# Patient Record
Sex: Female | Born: 1975 | Race: White | Hispanic: No | Marital: Married | Smoking: Current some day smoker
Health system: Southern US, Community
[De-identification: ages and names within clinical notes are randomized; demographics above are authoritative.]

## PROBLEM LIST (undated history)

## (undated) DIAGNOSIS — R569 Unspecified convulsions: Secondary | ICD-10-CM

## (undated) DIAGNOSIS — K649 Unspecified hemorrhoids: Secondary | ICD-10-CM

## (undated) DIAGNOSIS — F411 Generalized anxiety disorder: Secondary | ICD-10-CM

## (undated) DIAGNOSIS — F419 Anxiety disorder, unspecified: Secondary | ICD-10-CM

## (undated) DIAGNOSIS — R05 Cough: Secondary | ICD-10-CM

## (undated) DIAGNOSIS — G43909 Migraine, unspecified, not intractable, without status migrainosus: Secondary | ICD-10-CM

## (undated) DIAGNOSIS — Z972 Presence of dental prosthetic device (complete) (partial): Secondary | ICD-10-CM

## (undated) DIAGNOSIS — R000 Tachycardia, unspecified: Secondary | ICD-10-CM

## (undated) DIAGNOSIS — R011 Cardiac murmur, unspecified: Secondary | ICD-10-CM

## (undated) HISTORY — PX: NOVASURE ABLATION: SHX5394

## (undated) HISTORY — DX: Anxiety disorder, unspecified: F41.9

## (undated) HISTORY — PX: DILATION AND CURETTAGE OF UTERUS: SHX78

## (undated) HISTORY — DX: Cardiac murmur, unspecified: R01.1

## (undated) HISTORY — DX: Unspecified convulsions: R56.9

---
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
## 2005-10-26 ENCOUNTER — Inpatient Hospital Stay (HOSPITAL_COMMUNITY): Admission: AD | Admit: 2005-10-26 | Discharge: 2005-10-26 | Payer: Self-pay | Admitting: Family Medicine

## 2005-10-28 ENCOUNTER — Inpatient Hospital Stay (HOSPITAL_COMMUNITY): Admission: AD | Admit: 2005-10-28 | Discharge: 2005-10-28 | Payer: Self-pay | Admitting: Gynecology

## 2005-11-10 ENCOUNTER — Inpatient Hospital Stay (HOSPITAL_COMMUNITY): Admission: RE | Admit: 2005-11-10 | Discharge: 2005-11-10 | Payer: Self-pay | Admitting: Obstetrics and Gynecology

## 2005-12-20 ENCOUNTER — Inpatient Hospital Stay (HOSPITAL_COMMUNITY): Admission: AD | Admit: 2005-12-20 | Discharge: 2005-12-20 | Payer: Self-pay | Admitting: Obstetrics & Gynecology

## 2006-01-08 ENCOUNTER — Emergency Department (HOSPITAL_COMMUNITY): Admission: EM | Admit: 2006-01-08 | Discharge: 2006-01-08 | Payer: Self-pay | Admitting: Emergency Medicine

## 2006-04-21 ENCOUNTER — Inpatient Hospital Stay (HOSPITAL_COMMUNITY): Admission: AD | Admit: 2006-04-21 | Discharge: 2006-04-21 | Payer: Self-pay | Admitting: Obstetrics and Gynecology

## 2006-06-18 ENCOUNTER — Inpatient Hospital Stay (HOSPITAL_COMMUNITY): Admission: RE | Admit: 2006-06-18 | Discharge: 2006-06-20 | Payer: Self-pay | Admitting: Obstetrics and Gynecology

## 2006-10-23 ENCOUNTER — Emergency Department (HOSPITAL_COMMUNITY): Admission: EM | Admit: 2006-10-23 | Discharge: 2006-10-23 | Payer: Self-pay | Admitting: Emergency Medicine

## 2007-07-15 ENCOUNTER — Emergency Department (HOSPITAL_COMMUNITY): Admission: EM | Admit: 2007-07-15 | Discharge: 2007-07-15 | Payer: Self-pay | Admitting: Emergency Medicine

## 2007-07-18 ENCOUNTER — Emergency Department (HOSPITAL_COMMUNITY): Admission: EM | Admit: 2007-07-18 | Discharge: 2007-07-18 | Payer: Self-pay | Admitting: Emergency Medicine

## 2008-04-07 ENCOUNTER — Inpatient Hospital Stay (HOSPITAL_COMMUNITY): Admission: AD | Admit: 2008-04-07 | Discharge: 2008-04-07 | Payer: Self-pay | Admitting: Obstetrics and Gynecology

## 2008-04-07 ENCOUNTER — Ambulatory Visit: Payer: Self-pay | Admitting: Family Medicine

## 2008-08-31 ENCOUNTER — Inpatient Hospital Stay (HOSPITAL_COMMUNITY): Admission: RE | Admit: 2008-08-31 | Discharge: 2008-09-01 | Payer: Self-pay | Admitting: Obstetrics and Gynecology

## 2009-10-05 ENCOUNTER — Emergency Department (HOSPITAL_COMMUNITY): Admission: EM | Admit: 2009-10-05 | Discharge: 2009-10-06 | Payer: Self-pay | Admitting: Emergency Medicine

## 2009-12-04 ENCOUNTER — Encounter: Admission: RE | Admit: 2009-12-04 | Discharge: 2009-12-04 | Payer: Self-pay | Admitting: Family Medicine

## 2010-04-04 LAB — RAPID URINE DRUG SCREEN, HOSP PERFORMED
Amphetamines: NOT DETECTED
Barbiturates: NOT DETECTED
Opiates: NOT DETECTED
Tetrahydrocannabinol: NOT DETECTED

## 2010-04-27 LAB — CBC
HCT: 27.7 % — ABNORMAL LOW (ref 36.0–46.0)
HCT: 33.3 % — ABNORMAL LOW (ref 36.0–46.0)
Hemoglobin: 11.5 g/dL — ABNORMAL LOW (ref 12.0–15.0)
MCHC: 34.7 g/dL (ref 30.0–36.0)
MCV: 95.9 fL (ref 78.0–100.0)
RBC: 2.89 MIL/uL — ABNORMAL LOW (ref 3.87–5.11)
RBC: 3.5 MIL/uL — ABNORMAL LOW (ref 3.87–5.11)
RDW: 12.6 % (ref 11.5–15.5)
WBC: 8.3 10*3/uL (ref 4.0–10.5)

## 2010-05-02 LAB — WET PREP, GENITAL: Yeast Wet Prep HPF POC: NONE SEEN

## 2010-05-02 LAB — URINALYSIS, ROUTINE W REFLEX MICROSCOPIC
Nitrite: NEGATIVE
Protein, ur: NEGATIVE mg/dL
Specific Gravity, Urine: <1.005 — ABNORMAL LOW (ref 1.005–1.030)
Urobilinogen, UA: 0.2 mg/dL (ref 0.0–1.0)

## 2010-05-02 LAB — GC/CHLAMYDIA PROBE AMP, GENITAL
Chlamydia, DNA Probe: NEGATIVE
GC Probe Amp, Genital: NEGATIVE

## 2010-06-04 NOTE — Discharge Summary (Signed)
NAMEARRINGTON, BENCOMO NO.:  [id number]   MEDICAL RECORD NO.:  [id number]          PATIENT TYPE:  INP   LOCATION:  9126                          FACILITY:  [facility]   PHYSICIAN:  Huel Cote, M.D. DATE OF BIRTH:  [birth year]   DATE OF ADMISSION:  06/18/2006  DATE OF DISCHARGE:  06/20/2006                               DISCHARGE SUMMARY   DISCHARGE DIAGNOSES:  1. Term pregnancy at 39 weeks delivered.  2. Status post normal spontaneous vaginal delivery.   DISCHARGE MEDICATIONS:  1. Motrin 600 mg every 6 hours.  2. Percocet 1-2 tablets p.o. every 4 hours p.r.n.  3. Iron sulfate twice daily 325 mg.   DISCHARGE FOLLOWUP:  The patient is to follow up my office in  approximately 6 weeks.   HOSPITAL COURSE:  The patient is 35 year old G5, P 1-0-3-1 who was  admitted at [redacted] weeks gestation for induction of labor.  Prenatal care  was uncomplicated.  The patient did have a spouse who is serving in [place]  and could not be present for the delivery.  Therefore, they arranged web  cast of the delivery and for partially this social reason, the patient  was being induced.   PRENATAL LABORATORIES:  A+, antibody negative, GC negative, chlamydia  negative, group B strep negative.  One-hour Glucola was 129.  Hepatitis  B surface antigen negative, HIV negative, rubella immune, RPR  nonreactive.   PAST OBSTETRICAL HISTORY:  The patient has had 3 spontaneous losses.  She required D&Cs.  She had one vaginal delivery of a female infant  previously.   PAST MEDICAL HISTORY:  1. History of migraines.  2. History of anxiety and depression.   PAST SURGICAL HISTORY:  Tonsillectomy and adenoidectomy.   ALLERGIES:  She had no known drug allergies.   MEDICATIONS:  Prenatal vitamins.   PHYSICAL EXAMINATION ON ADMISSION:  VITAL SIGNS:  She was afebrile with  stable vital signs.  Fetal heart rate was reactive.  PELVIC:  Cervix was 2-3, 50% effaced and a -2 station.   She had rupture of  membranes performed with clear fluid noted.  She  progressed rapidly to complete and pushed well with a viable female  infant delivered.  Apgars were 9 at and one minute and 9 at five  minutes.  Weight was 6 pounds, 3 ounces.  Placenta delivered intact.  She had some the anterior right labial laceration and a left labial  laceration which was repaired with 3-0 Vicryl for hemostasis.  Immediately following delivery, the patient's hemoglobin was 7.9;  however, she remained asymptomatic.  She was ambulating well without any  dizziness and had no complaints really.  By postpartum day #2, she was  tolerating her p.o. medications and ambulating and her lochia was  normal.  Therefore, she was felt stable for discharge home and was  discharged to follow up in the office in 6 weeks.      Huel Cote, M.D.  Electronically Signed     KR/MEDQ  D:  06/20/2006  T:  06/20/2006  Job:  [id number]

## 2010-06-04 NOTE — H&P (Signed)
Melissa Haynes, Melissa Haynes              ACCOUNT NO.:  [id number]   MEDICAL RECORD NO.:  [id number]         PATIENT TYPE:  WINP   LOCATION:                                FACILITY:  [facility]   PHYSICIAN:  Sherron Monday, MD        DATE OF BIRTH:  15-Aug-1975   DATE OF ADMISSION:  08/31/2008  DATE OF DISCHARGE:                              HISTORY & PHYSICAL   ADMITTING DIAGNOSIS:  Intrauterine pregnancy at term with favorable  cervix for induction of labor.   PROCEDURE PLANNED:  Induction of labor.   HISTORY OF PRESENT ILLNESS:  Th[age]-year-old G6, P2-0-3-2 at 39 plus  weeks who presents for induction of labor, given favorable cervix.  She  states she has had good fetal movement.  No loss of fluid, no vaginal  bleeding and occasional contractions.  Her pregnancy has been relatively  uncomplicated.  For complete history, please refer back to her office  notes regarding her pregnancy.   PAST MEDICAL HISTORY:  Significant for anxiety and depression.  She has  a history of migraines.   PAST SURGICAL HISTORY:  Not significant.  She had a tonsillectomy in  1986.   GYN HISTORY:  She is a G6, P2-0-3-2 with G1, 2, 3 being first trimester  miscarriages, G4 being a vaginal delivery of a female infant who weighed 7  pounds 12 ounces, G5, being a vaginal delivery of a female infant,  weighing 6 pounds 3 ounces, G6 is the present pregnancy.  She has a  history of an abnormal Pap smear with repeat normal.  No history of  sexually transmitted diseases.   MEDICATIONS:  Include prenatal vitamins.   ALLERGIES:  NO KNOWN DRUG ALLERGIES.   SOCIAL HISTORY:  The patient admits to smoking one to two cigarettes per  day.  No alcohol or drug use in the pregnancy.   FAMILY HISTORY:  Significant for father's side of the family with heart  disease and mother and paternal grandmother with hypertension.  Psychiatric disorders on both sides of the family.  Sister with  diabetes.   PRENATAL LABS:  Hemoglobin is 11.9,  platelets 214,000.  RPR nonreactive,  rubella immune, hepatitis B surface antigen negative, HIV negative.  Pap  smear within normal limits.  A+, antibody screen negative, gonorrhea  negative, chlamydia negative, cystic fibrosis screen negative, first  trimester screen within normal limits.  AFP within normal limits.  Glucola of 119.  Group B strep screen was negative.  Ultrasound  performed on April 13, 2008 revealed normal anatomy, anterior placenta  and a previa that had resolved from her first trimester screen, also a  female infant.   PHYSICAL EXAM:  VITAL SIGNS:  On admission she is afebrile.  Vital signs  stable.  IN GENERAL:  No apparent distress.  CARDIOVASCULAR:  Regular rate and rhythm.  LUNGS:  Clear to auscultation bilaterally.  ABDOMEN:  Soft.  Fundus is nontender.  EXTREMITIES:  Symmetric and nontender.   ASSESSMENT/PLAN:  Th[age]-year-old G6, P2-0-3-2 at 39-plus weeks for  induction of labor, given favorable cervix.  Discussed with the patient  risks, benefits  and alternatives of the procedure.  She wishes to  proceed.  We will perform an artificial rupture of membranes and give  her Pitocin to augment her labor.      Sherron Monday, MD  Electronically Signed     JB/MEDQ  D:  08/30/2008  T:  08/30/2008  Job:  [id number]

## 2012-10-14 ENCOUNTER — Other Ambulatory Visit: Payer: Self-pay | Admitting: Internal Medicine

## 2012-10-14 ENCOUNTER — Ambulatory Visit
Admission: RE | Admit: 2012-10-14 | Discharge: 2012-10-14 | Disposition: A | Discharge status: Home / Self Care | Payer: 59 | Source: Ambulatory Visit | Attending: Internal Medicine | Admitting: Internal Medicine

## 2012-10-14 DIAGNOSIS — N200 Calculus of kidney: Secondary | ICD-10-CM

## 2012-10-14 DIAGNOSIS — R109 Unspecified abdominal pain: Secondary | ICD-10-CM

## 2012-10-15 ENCOUNTER — Other Ambulatory Visit: Payer: Self-pay | Admitting: Internal Medicine

## 2012-10-15 ENCOUNTER — Ambulatory Visit
Admission: RE | Admit: 2012-10-15 | Discharge: 2012-10-15 | Disposition: A | Discharge status: Home / Self Care | Payer: 59 | Source: Ambulatory Visit | Attending: Internal Medicine | Admitting: Internal Medicine

## 2012-10-15 DIAGNOSIS — IMO0002 Reserved for concepts with insufficient information to code with codable children: Secondary | ICD-10-CM

## 2012-10-27 ENCOUNTER — Emergency Department (HOSPITAL_COMMUNITY)
Admission: EM | Admit: 2012-10-27 | Discharge: 2012-10-28 | Disposition: A | Discharge status: Home / Self Care | Payer: 59 | Attending: Emergency Medicine | Admitting: Emergency Medicine

## 2012-10-27 ENCOUNTER — Encounter (HOSPITAL_COMMUNITY): Payer: Self-pay | Admitting: Emergency Medicine

## 2012-10-27 ENCOUNTER — Emergency Department (HOSPITAL_COMMUNITY): Payer: 59

## 2012-10-27 DIAGNOSIS — K644 Residual hemorrhoidal skin tags: Secondary | ICD-10-CM

## 2012-10-27 DIAGNOSIS — Z79899 Other long term (current) drug therapy: Secondary | ICD-10-CM | POA: Insufficient documentation

## 2012-10-27 DIAGNOSIS — K625 Hemorrhage of anus and rectum: Secondary | ICD-10-CM

## 2012-10-27 DIAGNOSIS — K921 Melena: Secondary | ICD-10-CM | POA: Insufficient documentation

## 2012-10-27 DIAGNOSIS — Z3202 Encounter for pregnancy test, result negative: Secondary | ICD-10-CM | POA: Insufficient documentation

## 2012-10-27 LAB — COMPREHENSIVE METABOLIC PANEL
ALT: 7 U/L (ref 0–35)
AST: 10 U/L (ref 0–37)
Alkaline Phosphatase: 40 U/L (ref 39–117)
CO2: 25 mEq/L (ref 19–32)
Calcium: 9 mg/dL (ref 8.4–10.5)
GFR calc non Af Amer: 77 mL/min — ABNORMAL LOW (ref >90)
Glucose, Bld: 102 mg/dL — ABNORMAL HIGH (ref 70–99)
Potassium: 3.5 mEq/L (ref 3.5–5.1)
Sodium: 140 mEq/L (ref 135–145)
Total Protein: 6.9 g/dL (ref 6.0–8.3)

## 2012-10-27 LAB — CBC WITH DIFFERENTIAL/PLATELET
Basophils Absolute: 0 10*3/uL (ref 0.0–0.1)
Eosinophils Relative: 2 % (ref 0–5)
Lymphocytes Relative: 22 % (ref 12–46)
Lymphs Abs: 1.8 10*3/uL (ref 0.7–4.0)
MCV: 87.9 fL (ref 78.0–100.0)
Neutrophils Relative %: 70 % (ref 43–77)
Platelets: 278 10*3/uL (ref 150–400)
RBC: 4.31 MIL/uL (ref 3.87–5.11)
RDW: 12 % (ref 11.5–15.5)
WBC: 8 10*3/uL (ref 4.0–10.5)

## 2012-10-27 NOTE — ED Notes (Signed)
Pt. reports rectal bleeding / bloody stools onset this morning , pt. stated results of CT scan taken 10/14/2012 shows metallic foreign object at RLQ .

## 2012-10-28 LAB — URINE MICROSCOPIC-ADD ON

## 2012-10-28 LAB — URINALYSIS, ROUTINE W REFLEX MICROSCOPIC
Bilirubin Urine: NEGATIVE
Hgb urine dipstick: NEGATIVE
Ketones, ur: NEGATIVE mg/dL
Specific Gravity, Urine: 1.025 (ref 1.005–1.030)
pH: 6 (ref 5.0–8.0)

## 2012-10-28 LAB — OCCULT BLOOD, POC DEVICE: Fecal Occult Bld: NEGATIVE

## 2012-10-28 NOTE — ED Provider Notes (Signed)
CSN: [id number]     Arrival date & time 10/27/12  2219 History   First MD Initiated Contact with Patient 10/27/12 2322     Chief Complaint  Patient presents with  . Rectal Bleeding   (Consider location/radiation/quality/duration/timing/severity/associated sxs/prior Treatment) HPI Comments: Patient presents with a chief complaint of rectal bleeding.  She states that she noticed a small amount of bright red blood with bowel movement this morning.  She states that she noticed the blood when she wiped.  She denies any pain with the bowel movement.  She denies melena.  Denies nausea, vomiting, or abdominal pain.  Denies fever or chills.  She reports that she has been diagnosed with hemorrhoids in the past.  She had a CT done of her abdomen and pelvis on 10/14/12, which showed a metallic foreign body of the RLQ.  Patient is unaware of what this could be.  She states that she is unsure what her Primary Care Physician plan was for this.    The history is provided by the patient.    History reviewed. No pertinent past medical history. History reviewed. No pertinent past surgical history. No family history on file. History  Substance Use Topics  . Smoking status: Never Smoker   . Smokeless tobacco: Not on file  . Alcohol Use: No   OB History   Grav Para Term Preterm Abortions TAB SAB Ect Mult Living                 Review of Systems  Gastrointestinal: Positive for blood in stool.  All other systems reviewed and are negative.    Allergies  Review of patient's allergies indicates not on file.  Home Medications   Current Outpatient Rx  Name  Route  Sig  Dispense  Refill  . clonazePAM (KLONOPIN) 0.5 MG tablet   Oral   Take 0.5 mg by mouth daily as needed for anxiety.         Marland Kitchen HYDROcodone-acetaminophen (NORCO/VICODIN) 5-325 MG per tablet   Oral   Take 1 tablet by mouth as needed.         . nortriptyline (PAMELOR) 25 MG capsule   Oral   Take 50 mg by mouth daily.         Marland Kitchen  topiramate (TOPAMAX) 200 MG tablet   Oral   Take 100 mg by mouth 2 (two) times daily.          BP 102/69  Pulse 100  Temp(Src) 98.2 F (36.8 C) (Oral)  Resp 16  Ht 5\' 2"  (1.575 m)  Wt 139 lb 9.6 oz (63.322 kg)  BMI 25.53 kg/m2  SpO2 100% Physical Exam  Nursing note and vitals reviewed. Constitutional: She appears well-developed and well-nourished.  HENT:  Head: Normocephalic and atraumatic.  Neck: Normal range of motion. Neck supple.  Cardiovascular: Normal rate, regular rhythm and normal heart sounds.   Pulmonary/Chest: Effort normal and breath sounds normal.  Abdominal: Soft. Bowel sounds are normal. She exhibits no distension and no mass. There is no tenderness. There is no rebound and no guarding.  Genitourinary: Rectal exam shows external hemorrhoid.  Non thrombosed external hemorrhoids  Neurological: She is alert.  Skin: Skin is warm and dry.  Psychiatric: She has a normal mood and affect.    ED Course  Procedures (including critical care time) Labs Review Labs Reviewed  COMPREHENSIVE METABOLIC PANEL - Abnormal; Notable for the following:    Glucose, Bld 102 (*)    Total Bilirubin 0.2 (*)  GFR calc non Af Amer 77 (*)    GFR calc Af Amer 90 (*)    All other components within normal limits  URINALYSIS, ROUTINE W REFLEX MICROSCOPIC - Abnormal; Notable for the following:    APPearance CLOUDY (*)    Leukocytes, UA TRACE (*)    All other components within normal limits  URINE MICROSCOPIC-ADD ON - Abnormal; Notable for the following:    Squamous Epithelial / LPF MANY (*)    Bacteria, UA MANY (*)    All other components within normal limits  URINE CULTURE  CBC WITH DIFFERENTIAL  PREGNANCY, URINE  OCCULT BLOOD X 1 CARD TO LAB, STOOL   Imaging Review Dg Abd Acute W/chest  10/28/2012   CLINICAL DATA:  Hematochezia, history of metallic foreign body in the colon  EXAM: ACUTE ABDOMEN SERIES (ABDOMEN 2 VIEW & CHEST 1 VIEW)  COMPARISON:  Prior CT abdomen/ pelvis  10/14/2012; prior abdominal radiographs 10/15/2012  FINDINGS: There is no evidence of dilated bowel loops or free intraperitoneal air. No radiopaque calculi or other significant radiographic abnormality is seen. Heart size and mediastinal contours are within normal limits. Both lungs are clear. Linear metallic opacities projecting over the chest are consistent with a personal adornments. The previously identified metallic foreign body in the cecum is no longer present.  IMPRESSION: Negative abdominal radiographs.  No acute cardiopulmonary disease.  The previously identified metallic foreign body in the region of the cecum is no longer present and has likely been passed.   Electronically Signed   By: Malachy Moan M.D.   On: 10/28/2012 00:29    MDM  No diagnosis found. Patient presenting with a small amount of bright red blood with bowel movement earlier today.  Rectal exam showing non thrombosed external hemorrhoids.  Hemoccult negative.  No abdominal pain on exam.  Previous foreign body of the RLQ is no longer present on xray today and likely has passed.  Feel that the patient is stable for discharge.  Return precautions given.    Pascal Lux [place], PA-C 10/29/12 [phone]

## 2012-10-28 NOTE — ED Notes (Signed)
Pt attempting to void. POCT occult blood card at bedside.

## 2012-10-28 NOTE — ED Notes (Signed)
PA at bedside.

## 2012-10-29 LAB — URINE CULTURE

## 2012-10-30 NOTE — ED Provider Notes (Signed)
Medical screening examination/treatment/procedure(s) were performed by non-physician practitioner and as supervising physician I was immediately available for consultation/collaboration.   Andreah Goheen, MD 10/30/12 0505 

## 2012-11-01 ENCOUNTER — Encounter (INDEPENDENT_AMBULATORY_CARE_PROVIDER_SITE_OTHER): Payer: Self-pay | Admitting: Surgery

## 2012-11-01 ENCOUNTER — Ambulatory Visit (INDEPENDENT_AMBULATORY_CARE_PROVIDER_SITE_OTHER): Payer: 59 | Admitting: Surgery

## 2012-11-01 VITALS — BP 118/78 | HR 100 | Temp 98.9°F | Resp 14 | Ht 62.0 in | Wt 142.0 lb

## 2012-11-01 DIAGNOSIS — K649 Unspecified hemorrhoids: Secondary | ICD-10-CM

## 2012-11-01 NOTE — Progress Notes (Signed)
Patient ID: Melissa Haynes, female   DOB: 1975-12-23, 37 y.o.   MRN: [id number]  Chief Complaint  Patient presents with  . New Evaluation    eval hems/ bleeding    HPI Melissa Haynes is a 37 y.o. female.  Patient sent at the request of Dr. Waynard Edwards for internal hemorrhoid disease. She has had a history of rectal bleeding and prolapse of tissue for the last 14 years since the birth of her role as child. She has mild to moderate discomfort with some burning and itching and rectal bleeding. She relates a history of right flank pain. This was evaluated by CT scanning which showed a foreign body or GI tract. She's not sure she is passed this but had some rectal bleeding which was evaluated in the emergency room and felt to be consistent with hemorrhoid bleeding. Most of this is when she wipes. She has not had constipation or more strain often. HPI  Past Medical History  Diagnosis Date  . Heart murmur   . Seizures     Past Surgical History  Procedure Laterality Date  . Novasure ablation    . Dilation and curettage of uterus      Family History  Problem Relation Age of Onset  . Cancer Father     pancreatic, lung    Social History History  Substance Use Topics  . Smoking status: Former Smoker    Quit date: 10/03/2010  . Smokeless tobacco: Never Used  . Alcohol Use: No    No Known Allergies  Current Outpatient Prescriptions  Medication Sig Dispense Refill  . clonazePAM (KLONOPIN) 0.5 MG tablet Take 0.5 mg by mouth daily as needed for anxiety.      Marland Kitchen HYDROcodone-acetaminophen (NORCO/VICODIN) 5-325 MG per tablet Take 1 tablet by mouth as needed.      . nortriptyline (PAMELOR) 25 MG capsule Take 50 mg by mouth daily.      . RELPAX 40 MG tablet       . topiramate (TOPAMAX) 200 MG tablet Take 100 mg by mouth 2 (two) times daily.       No current facility-administered medications for this visit.    Review of Systems Review of Systems  Constitutional: Negative for fever, chills  and unexpected weight change.  HENT: Negative for congestion, hearing loss, sore throat, trouble swallowing and voice change.   Eyes: Negative for visual disturbance.  Respiratory: Negative for cough and wheezing.   Cardiovascular: Negative for chest pain, palpitations and leg swelling.  Gastrointestinal: Negative for nausea, vomiting, abdominal pain, diarrhea, constipation, blood in stool, abdominal distention and anal bleeding.  Genitourinary: Negative for hematuria, vaginal bleeding and difficulty urinating.  Musculoskeletal: Negative for arthralgias.  Skin: Negative for rash and wound.  Neurological: Negative for seizures, syncope and headaches.  Hematological: Negative for adenopathy. Does not bruise/bleed easily.  Psychiatric/Behavioral: Negative for confusion.    Blood pressure 118/78, pulse 100, temperature 98.9 F (37.2 C), temperature source Temporal, resp. rate 14, height 5\' 2"  (1.575 m), weight 142 lb (64.411 kg).  Physical Exam Physical Exam  Constitutional: She appears well-developed.  Pulmonary/Chest: Breath sounds normal. No respiratory distress.  Genitourinary:     Musculoskeletal: Normal range of motion.  Neurological: She is alert.  Skin: Skin is warm and dry.  Psychiatric: She has a normal mood and affect. Her behavior is normal. Thought content normal.    Data Reviewed CT abdomen pelvis  Assessment    Grade 3 internal and external hemorrhoid disease with symptoms  Plan    Discussed medical and surgical approaches to her problem. Discussed pathophysiology with her today. These appear to be too large to band or inject. Discussed medical approaches to her problem. Discussed hemorrhoidectomy if her as well as the pros and cons. Complications discussed. Postoperative recovery and work limitations discussed. She would like time to think about surgery but has an interest in hemorrhoidectomy at this point in time.The procedure has been discussed with the  patient.  Alternative therapies have been discussed with the patient.  Operative risks include bleeding,  Infection,  Organ injury,  Nerve injury,  Blood vessel injury,  DVT,  Pulmonary embolism,  Death,  And possible reoperation.  Medical management risks include worsening of present situation.  The success of the procedure is 50 -90 % at treating patients symptoms.  The patient understands and agrees to proceed.       Melissa Haynes A. 11/01/2012, 3:57 PM

## 2012-11-01 NOTE — Patient Instructions (Signed)
Hemorrhoidectomy Hemorrhoidectomy is surgery to remove hemorrhoids. Hemorrhoids are veins that have become swollen in the rectum. The rectum is the area from the bottom end of the intestines to the opening where bowel movements leave the body. Hemorrhoids can be uncomfortable. They can cause itching, bleeding and pain if a blood clot forms in them (thrombose). If hemorrhoids are small, surgery may not be needed. But if they cover a larger area, surgery is usually suggested.  LET YOUR CAREGIVER KNOW ABOUT:   Any allergies.  All medications you are taking, including:  Herbs, eyedrops, over-the-counter medications and creams.  Blood thinners (anticoagulants), aspirin or other drugs that could affect blood clotting.  Use of steroids (by mouth or as creams).  Previous problems with anesthetics, including local anesthetics.  Possibility of pregnancy, if this applies.  Any history of blood clots.  Any history of bleeding or other blood problems.  Previous surgery.  Smoking history.  Other health problems. RISKS AND COMPLICATIONS All surgery carries some risk. However, hemorrhoid surgery usually goes smoothly. Possible complications could include:  Urinary retention.  Bleeding.  Infection.  A painful incision.  A reaction to the anesthesia (this is not common). BEFORE THE PROCEDURE   Stop using aspirin and non-steroidal anti-inflammatory drugs (NSAIDs) for pain relief. This includes prescription drugs and over-the-counter drugs such as ibuprofen and naproxen. Also stop taking vitamin E. If possible, do this two weeks before your surgery.  If you take blood-thinners, ask your healthcare provider when you should stop taking them.  You will probably have blood and urine tests done several days before your surgery.  Do not eat or drink for about 8 hours before the surgery.  Arrive at least an hour before the surgery, or whenever your surgeon recommends. This will give you time to  check in and fill out any needed paperwork.  Hemorrhoidectomy is often an outpatient procedure. This means you will be able to go home the same day. Sometimes, though, people stay overnight in the hospital after the procedure. Ask your surgeon what to expect. Either way, make arrangements in advance for someone to drive you home. PROCEDURE   The preparation:  You will change into a hospital gown.  You will be given an IV. A needle will be inserted in your arm. Medication can flow directly into your body through this needle.  You might be given an enema to clear your rectum.  Once in the operating room, you will probably lie on your side or be repositioned later to lying on your stomach.  You will be given anesthesia (medication) so you will not feel anything during the surgery. The surgery often is done with local anesthesia (the area near the hemorrhoids will be numb and you will be drowsy but awake). Sometimes, general anesthesia is used (you will be asleep during the procedure).  The procedure:  There are a few different procedures for hemorrhoids. Be sure to ask you surgeon about the procedure, the risks and benefits.  Be sure to ask about what you need to do to take care of the wound, if there is one. AFTER THE PROCEDURE  You will stay in a recovery area until the anesthesia has worn off. Your blood pressure and pulse will be checked every so often.  You may feel a lot of pain in the area of the rectum.  Take all pain medication prescribed by your surgeon. Ask before taking any over-the-counter pain medicines.  Sometimes sitting in a warm bath can help relieve   your pain.  To make sure you have bowel movements without straining:  You will probably need to take stool softeners (usually a pill) for a few days.  You should drink 8 to 10 glasses of water each day.  Your activity will be restricted for awhile. Ask your caregiver for a list of what you should and should not do  while you recover. Document Released: 11/03/2008 Document Revised: 03/31/2011 Document Reviewed: 11/03/2008 [employer] Patient Information 2014 [employer].  

## 2012-12-17 ENCOUNTER — Telehealth (INDEPENDENT_AMBULATORY_CARE_PROVIDER_SITE_OTHER): Payer: Self-pay | Admitting: Surgery

## 2012-12-17 NOTE — Telephone Encounter (Signed)
Patient called with two week history of rectal bleeding, now becoming more frequent and persistent past two days.  Please arrange evaluation with Dr. Luisa Hart or in Urgent Office on Monday, 12/1.  I told patient our office would call her on Monday morning.  I told patient if bleeding became more severe to go to ER for assessment this weekend.  I advised baby wipes, tub soaks, and stool softeners.  Velora Heckler, MD, [facility] Office: [phone]

## 2012-12-20 ENCOUNTER — Telehealth (INDEPENDENT_AMBULATORY_CARE_PROVIDER_SITE_OTHER): Payer: Self-pay

## 2012-12-20 DIAGNOSIS — K649 Unspecified hemorrhoids: Secondary | ICD-10-CM

## 2012-12-20 HISTORY — DX: Unspecified hemorrhoids: K64.9

## 2012-12-20 NOTE — Telephone Encounter (Signed)
LMOM> Called pt to check on her and to make appointment for her to see Dr Luisa Hart this afternoon. Will await return call from pt.

## 2012-12-20 NOTE — Telephone Encounter (Signed)
Pt returning Melissa Haynes's call.  Message below relayed.  Pt states that she is doing better.  The pain has subsided, and the rectal bleeding as stopped as of today.  Pt states that she is working today and will not be able to come in today.  Made appointment for Tuesday, Dec 9 to see Dr. Luisa Hart.

## 2012-12-20 NOTE — Telephone Encounter (Signed)
This encounter was created in error - please disregard.

## 2012-12-28 ENCOUNTER — Ambulatory Visit (INDEPENDENT_AMBULATORY_CARE_PROVIDER_SITE_OTHER): Payer: 59 | Admitting: Surgery

## 2012-12-28 ENCOUNTER — Encounter (INDEPENDENT_AMBULATORY_CARE_PROVIDER_SITE_OTHER): Payer: Self-pay

## 2012-12-28 ENCOUNTER — Encounter (INDEPENDENT_AMBULATORY_CARE_PROVIDER_SITE_OTHER): Payer: Self-pay | Admitting: Surgery

## 2012-12-28 VITALS — BP 132/82 | HR 112 | Temp 98.0°F | Resp 18 | Ht 64.0 in | Wt 135.0 lb

## 2012-12-28 DIAGNOSIS — K649 Unspecified hemorrhoids: Secondary | ICD-10-CM

## 2012-12-28 MED ORDER — HYDROCORTISONE ACE-PRAMOXINE 1-1 % RE FOAM: 1.0000 | Freq: Two times a day (BID) | RECTAL | Status: DC

## 2012-12-28 NOTE — Patient Instructions (Signed)
Hemorrhoidectomy Hemorrhoidectomy is surgery to remove hemorrhoids. Hemorrhoids are veins that have become swollen in the rectum. The rectum is the area from the bottom end of the intestines to the opening where bowel movements leave the body. Hemorrhoids can be uncomfortable. They can cause itching, bleeding and pain if a blood clot forms in them (thrombose). If hemorrhoids are small, surgery may not be needed. But if they cover a larger area, surgery is usually suggested.  LET YOUR CAREGIVER KNOW ABOUT:   Any allergies.  All medications you are taking, including:  Herbs, eyedrops, over-the-counter medications and creams.  Blood thinners (anticoagulants), aspirin or other drugs that could affect blood clotting.  Use of steroids (by mouth or as creams).  Previous problems with anesthetics, including local anesthetics.  Possibility of pregnancy, if this applies.  Any history of blood clots.  Any history of bleeding or other blood problems.  Previous surgery.  Smoking history.  Other health problems. RISKS AND COMPLICATIONS All surgery carries some risk. However, hemorrhoid surgery usually goes smoothly. Possible complications could include:  Urinary retention.  Bleeding.  Infection.  A painful incision.  A reaction to the anesthesia (this is not common). BEFORE THE PROCEDURE   Stop using aspirin and non-steroidal anti-inflammatory drugs (NSAIDs) for pain relief. This includes prescription drugs and over-the-counter drugs such as ibuprofen and naproxen. Also stop taking vitamin E. If possible, do this two weeks before your surgery.  If you take blood-thinners, ask your healthcare provider when you should stop taking them.  You will probably have blood and urine tests done several days before your surgery.  Do not eat or drink for about 8 hours before the surgery.  Arrive at least an hour before the surgery, or whenever your surgeon recommends. This will give you time to  check in and fill out any needed paperwork.  Hemorrhoidectomy is often an outpatient procedure. This means you will be able to go home the same day. Sometimes, though, people stay overnight in the hospital after the procedure. Ask your surgeon what to expect. Either way, make arrangements in advance for someone to drive you home. PROCEDURE   The preparation:  You will change into a hospital gown.  You will be given an IV. A needle will be inserted in your arm. Medication can flow directly into your body through this needle.  You might be given an enema to clear your rectum.  Once in the operating room, you will probably lie on your side or be repositioned later to lying on your stomach.  You will be given anesthesia (medication) so you will not feel anything during the surgery. The surgery often is done with local anesthesia (the area near the hemorrhoids will be numb and you will be drowsy but awake). Sometimes, general anesthesia is used (you will be asleep during the procedure).  The procedure:  There are a few different procedures for hemorrhoids. Be sure to ask you surgeon about the procedure, the risks and benefits.  Be sure to ask about what you need to do to take care of the wound, if there is one. AFTER THE PROCEDURE  You will stay in a recovery area until the anesthesia has worn off. Your blood pressure and pulse will be checked every so often.  You may feel a lot of pain in the area of the rectum.  Take all pain medication prescribed by your surgeon. Ask before taking any over-the-counter pain medicines.  Sometimes sitting in a warm bath can help relieve   your pain.  To make sure you have bowel movements without straining:  You will probably need to take stool softeners (usually a pill) for a few days.  You should drink 8 to 10 glasses of water each day.  Your activity will be restricted for awhile. Ask your caregiver for a list of what you should and should not do  while you recover. Document Released: 11/03/2008 Document Revised: 03/31/2011 Document Reviewed: 11/03/2008 [employer] Patient Information 2014 [employer].  

## 2012-12-28 NOTE — Progress Notes (Signed)
Patient ID: Melissa Haynes, female   DOB: 11-23-1975, 37 y.o.   MRN: [id number]  Chief Complaint  Patient presents with  . Routine Post Op    rectal bleeding    HPI Melissa Haynes is a 37 y.o. female.  Patient sent at the request of Dr. Waynard Edwards for internal hemorrhoid disease. She has had a history of rectal bleeding and prolapse of tissue for the last 14 years since the birth of her role as child. She has mild to moderate discomfort with some burning and itching and rectal bleeding. She relates a history of right flank pain. This was evaluated by CT scanning which showed a foreign body or GI tract. She's not sure she is passed this but had some rectal bleeding which was evaluated in the emergency room and felt to be consistent with hemorrhoid bleeding. Most of this is when she wipes. She has not had constipation or more strain often.she is developed increased bleeding 3 weeks ago after sexual intercourse.. Patient denies anal intercourse. She called the on-call doctor for consultation. The bleeding stopped after about a week. It was intermittent and noticed after bowel movements. Is not continuous. It was dark in nature. It is now resolved.  HPI  Past Medical History  Diagnosis Date  . Heart murmur   . Seizures     Past Surgical History  Procedure Laterality Date  . Novasure ablation    . Dilation and curettage of uterus      Family History  Problem Relation Age of Onset  . Cancer Father     pancreatic, lung    Social History History  Substance Use Topics  . Smoking status: Former Smoker    Quit date: 10/03/2010  . Smokeless tobacco: Never Used  . Alcohol Use: No    No Known Allergies  Current Outpatient Prescriptions  Medication Sig Dispense Refill  . clonazePAM (KLONOPIN) 0.5 MG tablet Take 0.5 mg by mouth daily as needed for anxiety.      Marland Kitchen HYDROcodone-acetaminophen (NORCO/VICODIN) 5-325 MG per tablet Take 1 tablet by mouth as needed.      . nortriptyline (PAMELOR)  25 MG capsule Take 50 mg by mouth daily.      . RELPAX 40 MG tablet       . topiramate (TOPAMAX) 200 MG tablet Take 100 mg by mouth 2 (two) times daily.      . hydrocortisone-pramoxine (PROCTOFOAM HC) rectal foam Place 1 applicator rectally 2 (two) times daily.  10 g  0   No current facility-administered medications for this visit.    Review of Systems Review of Systems  Constitutional: Negative for fever, chills and unexpected weight change.  HENT: Negative for congestion, hearing loss, sore throat, trouble swallowing and voice change.   Eyes: Negative for visual disturbance.  Respiratory: Negative for cough and wheezing.   Cardiovascular: Negative for chest pain, palpitations and leg swelling.  Gastrointestinal: Negative for nausea, vomiting, abdominal pain, diarrhea, constipation, blood in stool, abdominal distention and anal bleeding.  Genitourinary: Negative for hematuria, vaginal bleeding and difficulty urinating.  Musculoskeletal: Negative for arthralgias.  Skin: Negative for rash and wound.  Neurological: Negative for seizures, syncope and headaches.  Hematological: Negative for adenopathy. Does not bruise/bleed easily.  Psychiatric/Behavioral: Negative for confusion.    Blood pressure 132/82, pulse 112, temperature 98 F (36.7 C), resp. rate 18, height 5\' 4"  (1.626 m), weight 135 lb (61.236 kg).  Physical Exam Physical Exam  Constitutional: She appears well-developed.  Pulmonary/Chest: Breath sounds  normal. No respiratory distress.  Genitourinary: grade 3 hemorrhoids noted. Mild prolapse noted. Reducible but friable. Tone  normal. Anoscopy: 3 column grade 3 congested internal hemorrhoid disease. Recommended sclerotherapy to two of these columns since they appeared to have been bleeding at some point. Discussed the procedure with the patient of sclerotherapy. She agreed to proceed after risks and benefits of the procedure were discussed. 1 cc of sclerosant was injected into  the right posterior column and left lateral column since the 2 had signs of recent bleeding and were friable. She tolerated the procedure well.   Musculoskeletal: Normal range of motion.  Neurological: She is alert.  Skin: Skin is warm and dry.  Psychiatric: She has a normal mood and affect. Her behavior is normal. Thought content normal.    Data Reviewed CT abdomen pelvis  Assessment    Grade 3 internal and external hemorrhoid disease with symptoms of increased bleeding    Plan    Discussed medical and surgical approaches to her problem. Discussed pathophysiology with her today. Sclerotherapy used with the hope of decreasing her bleeding episodes but she will require hemorrhoidectomy for definitive treatment. Proctofoam can be used in the meantime history down the tissue and hopefully keep her bleeding episodes minimal. Discussed medical approaches to her problem. Discussed hemorrhoidectomy with her as well as the pros and cons. Complications discussed. Postoperative recovery and work limitations discussed. She is ready for hemorrhoidectomy at this point in time.The procedure has been discussed with the patient.  Alternative therapies have been discussed with the patient.  Operative risks include bleeding,  Infection,  Organ injury,  Nerve injury,anal stenosis, recurrence,  Blood vessel injury,  DVT,  Pulmonary embolism,  Death,  And possible reoperation.  Medical management risks include worsening of present situation.  The success of the procedure is 50 -90 % at treating patients symptoms.  The patient understands and agrees to proceed.       Fredrick Dray A. 12/28/2012, 5:00 PM

## 2013-01-19 ENCOUNTER — Encounter (HOSPITAL_BASED_OUTPATIENT_CLINIC_OR_DEPARTMENT_OTHER): Payer: Self-pay | Admitting: *Deleted

## 2013-01-19 DIAGNOSIS — R058 Other specified cough: Secondary | ICD-10-CM

## 2013-01-19 HISTORY — DX: Other specified cough: R05.8

## 2013-01-19 NOTE — Pre-Procedure Instructions (Signed)
Discussed hx. of heart murmur and tachycardia with Dr. Ivin Booty; do an EKG when she comes for labs.

## 2013-01-19 NOTE — Pre-Procedure Instructions (Signed)
To come for CBC, diff, CMET 

## 2013-01-21 ENCOUNTER — Other Ambulatory Visit: Payer: Self-pay

## 2013-01-21 ENCOUNTER — Encounter (HOSPITAL_BASED_OUTPATIENT_CLINIC_OR_DEPARTMENT_OTHER)
Admission: RE | Admit: 2013-01-21 | Discharge: 2013-01-21 | Disposition: A | Discharge status: Home / Self Care | Payer: 59 | Source: Ambulatory Visit | Attending: Surgery | Admitting: Surgery

## 2013-01-21 DIAGNOSIS — Z01810 Encounter for preprocedural cardiovascular examination: Secondary | ICD-10-CM | POA: Insufficient documentation

## 2013-01-21 DIAGNOSIS — Z01818 Encounter for other preprocedural examination: Secondary | ICD-10-CM | POA: Insufficient documentation

## 2013-01-21 DIAGNOSIS — Z01812 Encounter for preprocedural laboratory examination: Secondary | ICD-10-CM | POA: Insufficient documentation

## 2013-01-21 LAB — COMPREHENSIVE METABOLIC PANEL
ALK PHOS: 34 U/L — AB (ref 39–117)
ALT: 11 U/L (ref 0–35)
AST: 11 U/L (ref 0–37)
Albumin: 3.9 g/dL (ref 3.5–5.2)
BILIRUBIN TOTAL: 0.3 mg/dL (ref 0.3–1.2)
BUN: 8 mg/dL (ref 6–23)
CHLORIDE: 109 meq/L (ref 96–112)
CO2: 24 meq/L (ref 19–32)
Calcium: 8.8 mg/dL (ref 8.4–10.5)
Creatinine, Ser: 0.85 mg/dL (ref 0.50–1.10)
GFR calc non Af Amer: 88 mL/min — ABNORMAL LOW (ref >90)
GLUCOSE: 81 mg/dL (ref 70–99)
POTASSIUM: 3.9 meq/L (ref 3.7–5.3)
Sodium: 145 mEq/L (ref 137–147)
TOTAL PROTEIN: 7 g/dL (ref 6.0–8.3)

## 2013-01-21 LAB — CBC WITH DIFFERENTIAL/PLATELET
Basophils Absolute: 0 10*3/uL (ref 0.0–0.1)
Basophils Relative: 0 % (ref 0–1)
EOS ABS: 0.1 10*3/uL (ref 0.0–0.7)
Eosinophils Relative: 2 % (ref 0–5)
HCT: 39.2 % (ref 36.0–46.0)
Hemoglobin: 13.9 g/dL (ref 12.0–15.0)
LYMPHS ABS: 1.8 10*3/uL (ref 0.7–4.0)
Lymphocytes Relative: 28 % (ref 12–46)
MCH: 31 pg (ref 26.0–34.0)
MCHC: 35.5 g/dL (ref 30.0–36.0)
MCV: 87.3 fL (ref 78.0–100.0)
MONOS PCT: 7 % (ref 3–12)
Monocytes Absolute: 0.5 10*3/uL (ref 0.1–1.0)
NEUTROS PCT: 63 % (ref 43–77)
Neutro Abs: 4.2 10*3/uL (ref 1.7–7.7)
Platelets: 307 10*3/uL (ref 150–400)
RBC: 4.49 MIL/uL (ref 3.87–5.11)
RDW: 12.3 % (ref 11.5–15.5)
WBC: 6.6 10*3/uL (ref 4.0–10.5)

## 2013-01-25 ENCOUNTER — Encounter (HOSPITAL_BASED_OUTPATIENT_CLINIC_OR_DEPARTMENT_OTHER): Payer: Self-pay | Admitting: *Deleted

## 2013-01-26 ENCOUNTER — Encounter (HOSPITAL_BASED_OUTPATIENT_CLINIC_OR_DEPARTMENT_OTHER)
Admission: RE | Disposition: A | Discharge status: Home / Self Care | Payer: Self-pay | Source: Ambulatory Visit | Attending: Surgery

## 2013-01-26 ENCOUNTER — Encounter (HOSPITAL_BASED_OUTPATIENT_CLINIC_OR_DEPARTMENT_OTHER): Payer: 59 | Admitting: Anesthesiology

## 2013-01-26 ENCOUNTER — Encounter (HOSPITAL_BASED_OUTPATIENT_CLINIC_OR_DEPARTMENT_OTHER): Payer: Self-pay | Admitting: Anesthesiology

## 2013-01-26 ENCOUNTER — Ambulatory Visit (HOSPITAL_BASED_OUTPATIENT_CLINIC_OR_DEPARTMENT_OTHER): Payer: 59 | Admitting: Anesthesiology

## 2013-01-26 ENCOUNTER — Ambulatory Visit (HOSPITAL_BASED_OUTPATIENT_CLINIC_OR_DEPARTMENT_OTHER)
Admission: RE | Admit: 2013-01-26 | Discharge: 2013-01-26 | Disposition: A | Discharge status: Home / Self Care | Payer: 59 | Source: Ambulatory Visit | Attending: Surgery | Admitting: Surgery

## 2013-01-26 DIAGNOSIS — Z87891 Personal history of nicotine dependence: Secondary | ICD-10-CM | POA: Insufficient documentation

## 2013-01-26 DIAGNOSIS — K644 Residual hemorrhoidal skin tags: Secondary | ICD-10-CM

## 2013-01-26 DIAGNOSIS — K648 Other hemorrhoids: Secondary | ICD-10-CM | POA: Insufficient documentation

## 2013-01-26 DIAGNOSIS — K649 Unspecified hemorrhoids: Secondary | ICD-10-CM

## 2013-01-26 HISTORY — PX: HEMORRHOID SURGERY: SHX153

## 2013-01-26 HISTORY — DX: Generalized anxiety disorder: F41.1

## 2013-01-26 HISTORY — DX: Unspecified hemorrhoids: K64.9

## 2013-01-26 HISTORY — DX: Presence of dental prosthetic device (complete) (partial): Z97.2

## 2013-01-26 HISTORY — DX: Migraine, unspecified, not intractable, without status migrainosus: G43.909

## 2013-01-26 HISTORY — DX: Cough: R05

## 2013-01-26 HISTORY — DX: Tachycardia, unspecified: R00.0

## 2013-01-26 LAB — POCT HEMOGLOBIN-HEMACUE: HEMOGLOBIN: 13.6 g/dL (ref 12.0–15.0)

## 2013-01-26 SURGERY — HEMORRHOIDECTOMY
Anesthesia: General | Site: Rectum

## 2013-01-26 MED ORDER — MIDAZOLAM HCL 2 MG/ML PO SYRP: 12.0000 mg | ORAL_SOLUTION | Freq: Once | ORAL | Status: DC | PRN

## 2013-01-26 MED ORDER — FLEET ENEMA 7-19 GM/118ML RE ENEM: 1.0000 | ENEMA | Freq: Once | RECTAL | Status: DC

## 2013-01-26 MED ORDER — DEXAMETHASONE SODIUM PHOSPHATE 4 MG/ML IJ SOLN
INTRAMUSCULAR | Status: DC | PRN
  Administered 2013-01-26: 10 mg via INTRAVENOUS

## 2013-01-26 MED ORDER — DEXTROSE 5 % IV SOLN
3.0000 g | INTRAVENOUS | Status: AC
  Administered 2013-01-26: 2 g via INTRAVENOUS

## 2013-01-26 MED ORDER — METOCLOPRAMIDE HCL 5 MG/ML IJ SOLN: 10.0000 mg | Freq: Once | INTRAMUSCULAR | Status: DC | PRN

## 2013-01-26 MED ORDER — CEFAZOLIN SODIUM 1-5 GM-% IV SOLN
INTRAVENOUS | Status: AC
  Filled 2013-01-26: qty 100

## 2013-01-26 MED ORDER — OXYCODONE-ACETAMINOPHEN 10-325 MG PO TABS: 1.0000 | ORAL_TABLET | Freq: Four times a day (QID) | ORAL | Status: DC | PRN

## 2013-01-26 MED ORDER — OXYCODONE HCL 5 MG/5ML PO SOLN: 5.0000 mg | Freq: Once | ORAL | Status: AC | PRN

## 2013-01-26 MED ORDER — HYDROMORPHONE HCL PF 1 MG/ML IJ SOLN
0.2500 mg | INTRAMUSCULAR | Status: DC | PRN
  Administered 2013-01-26: 0.5 mg via INTRAVENOUS
  Filled 2013-01-26: qty 1

## 2013-01-26 MED ORDER — LACTATED RINGERS IV SOLN
INTRAVENOUS | Status: DC
  Administered 2013-01-26 (×2): via INTRAVENOUS

## 2013-01-26 MED ORDER — FENTANYL CITRATE 0.05 MG/ML IJ SOLN
INTRAMUSCULAR | Status: DC | PRN
  Administered 2013-01-26 (×4): 50 ug via INTRAVENOUS

## 2013-01-26 MED ORDER — LIDOCAINE HCL 2 % EX GEL: 1.0000 "application " | CUTANEOUS | Status: DC | PRN

## 2013-01-26 MED ORDER — SODIUM CHLORIDE 0.9 % IJ SOLN
INTRAMUSCULAR | Status: DC | PRN
  Administered 2013-01-26: 12:00:00

## 2013-01-26 MED ORDER — FENTANYL CITRATE 0.05 MG/ML IJ SOLN
INTRAMUSCULAR | Status: AC
  Filled 2013-01-26: qty 6

## 2013-01-26 MED ORDER — LIDOCAINE HCL (CARDIAC) 20 MG/ML IV SOLN
INTRAVENOUS | Status: DC | PRN
  Administered 2013-01-26: 75 mg via INTRAVENOUS

## 2013-01-26 MED ORDER — BUPIVACAINE LIPOSOME 1.3 % IJ SUSP
INTRAMUSCULAR | Status: AC
  Filled 2013-01-26: qty 20

## 2013-01-26 MED ORDER — MIDAZOLAM HCL 2 MG/2ML IJ SOLN: 1.0000 mg | INTRAMUSCULAR | Status: DC | PRN

## 2013-01-26 MED ORDER — POLYETHYLENE GLYCOL 3350 17 GM/SCOOP PO POWD: 1.0000 | Freq: Once | ORAL | Status: DC

## 2013-01-26 MED ORDER — MIDAZOLAM HCL 2 MG/2ML IJ SOLN
INTRAMUSCULAR | Status: AC
  Filled 2013-01-26: qty 2

## 2013-01-26 MED ORDER — MIDAZOLAM HCL 5 MG/5ML IJ SOLN
INTRAMUSCULAR | Status: DC | PRN
  Administered 2013-01-26: 2 mg via INTRAVENOUS

## 2013-01-26 MED ORDER — OXYCODONE HCL 5 MG PO TABS
5.0000 mg | ORAL_TABLET | Freq: Once | ORAL | Status: AC | PRN
  Administered 2013-01-26: 5 mg via ORAL
  Filled 2013-01-26: qty 1

## 2013-01-26 MED ORDER — CEFAZOLIN SODIUM 1-5 GM-% IV SOLN
INTRAVENOUS | Status: AC
  Filled 2013-01-26: qty 150

## 2013-01-26 MED ORDER — IBUPROFEN 200 MG PO TABS: 600.0000 mg | ORAL_TABLET | Freq: Four times a day (QID) | ORAL | Status: AC | PRN

## 2013-01-26 MED ORDER — BUPIVACAINE-EPINEPHRINE PF 0.25-1:200000 % IJ SOLN
INTRAMUSCULAR | Status: AC
  Filled 2013-01-26: qty 30

## 2013-01-26 MED ORDER — CHLORHEXIDINE GLUCONATE 4 % EX LIQD: 1.0000 "application " | Freq: Once | CUTANEOUS | Status: DC

## 2013-01-26 MED ORDER — FENTANYL CITRATE 0.05 MG/ML IJ SOLN: 50.0000 ug | INTRAMUSCULAR | Status: DC | PRN

## 2013-01-26 MED ORDER — SODIUM CHLORIDE 0.9 % IJ SOLN
INTRAMUSCULAR | Status: AC
  Filled 2013-01-26: qty 20

## 2013-01-26 MED ORDER — PROPOFOL 10 MG/ML IV BOLUS
INTRAVENOUS | Status: DC | PRN
  Administered 2013-01-26: 200 mg via INTRAVENOUS

## 2013-01-26 SURGICAL SUPPLY — 49 items
APL SKNCLS STERI-STRIP NONHPOA (GAUZE/BANDAGES/DRESSINGS) ×1
BENZOIN TINCTURE PRP APPL 2/3 (GAUZE/BANDAGES/DRESSINGS) ×3 IMPLANT
BLADE SURG 11 STRL SS (BLADE) IMPLANT
BLADE SURG 15 STRL LF DISP TIS (BLADE) ×1 IMPLANT
BLADE SURG 15 STRL SS (BLADE) ×3
BRIEF STRETCH FOR OB PAD LRG (UNDERPADS AND DIAPERS) ×2 IMPLANT
CANISTER SUCT 1200ML W/VALVE (MISCELLANEOUS) ×3 IMPLANT
COVER MAYO STAND STRL (DRAPES) IMPLANT
COVER TABLE BACK 60X90 (DRAPES) IMPLANT
DECANTER SPIKE VIAL GLASS SM (MISCELLANEOUS) IMPLANT
DRAPE PED LAPAROTOMY (DRAPES) IMPLANT
DRAPE UTILITY XL STRL (DRAPES) ×3 IMPLANT
ELECT COATED BLADE 2.86 ST (ELECTRODE) ×3 IMPLANT
ELECT REM PT RETURN 9FT ADLT (ELECTROSURGICAL) ×3
ELECTRODE REM PT RTRN 9FT ADLT (ELECTROSURGICAL) ×1 IMPLANT
GLOVE BIO SURGEON STRL SZ 6.5 (GLOVE) ×1 IMPLANT
GLOVE BIO SURGEONS STRL SZ 6.5 (GLOVE) ×1
GLOVE BIOGEL PI IND STRL 7.0 (GLOVE) IMPLANT
GLOVE BIOGEL PI IND STRL 8 (GLOVE) ×1 IMPLANT
GLOVE BIOGEL PI INDICATOR 7.0 (GLOVE) ×2
GLOVE BIOGEL PI INDICATOR 8 (GLOVE) ×2
GLOVE ECLIPSE 8.0 STRL XLNG CF (GLOVE) ×3 IMPLANT
GLOVE EXAM NITRILE PF MED BLUE (GLOVE) ×2 IMPLANT
GOWN STRL REUS W/ TWL LRG LVL3 (GOWN DISPOSABLE) ×2 IMPLANT
GOWN STRL REUS W/TWL LRG LVL3 (GOWN DISPOSABLE) ×6
HEMOSTAT SURGICEL 2X14 (HEMOSTASIS) ×2 IMPLANT
NDL HYPO 25X1 1.5 SAFETY (NEEDLE) ×1 IMPLANT
NEEDLE HYPO 25X1 1.5 SAFETY (NEEDLE) ×3 IMPLANT
PACK BASIN DAY SURGERY FS (CUSTOM PROCEDURE TRAY) ×3 IMPLANT
PACK LITHOTOMY IV (CUSTOM PROCEDURE TRAY) ×2 IMPLANT
PAD ABD 8X10 STRL (GAUZE/BANDAGES/DRESSINGS) ×3 IMPLANT
PENCIL BUTTON HOLSTER BLD 10FT (ELECTRODE) ×3 IMPLANT
SHEARS HARMONIC 9CM CVD (BLADE) ×2 IMPLANT
SLEEVE SCD COMPRESS KNEE MED (MISCELLANEOUS) ×3 IMPLANT
SPONGE GAUZE 4X4 12PLY STER LF (GAUZE/BANDAGES/DRESSINGS) ×2 IMPLANT
SPONGE SURGIFOAM ABS GEL 100 (HEMOSTASIS) ×2 IMPLANT
SURGILUBE 2OZ TUBE FLIPTOP (MISCELLANEOUS) ×3 IMPLANT
SUT MON AB 3-0 SH 27 (SUTURE) ×6
SUT MON AB 3-0 SH27 (SUTURE) ×2 IMPLANT
SYR CONTROL 10ML LL (SYRINGE) ×3 IMPLANT
TAPE CLOTH 3X10 TAN LF (GAUZE/BANDAGES/DRESSINGS) ×3 IMPLANT
TAPE CLOTH SURG 6X10 WHT LF (GAUZE/BANDAGES/DRESSINGS) IMPLANT
TOWEL OR 17X24 6PK STRL BLUE (TOWEL DISPOSABLE) ×3 IMPLANT
TOWEL OR NON WOVEN STRL DISP B (DISPOSABLE) ×3 IMPLANT
TRAY DSU PREP LF (CUSTOM PROCEDURE TRAY) ×3 IMPLANT
TUBE CONNECTING 20'X1/4 (TUBING) ×1
TUBE CONNECTING 20X1/4 (TUBING) ×2 IMPLANT
UNDERPAD 30X30 INCONTINENT (UNDERPADS AND DIAPERS) ×3 IMPLANT
YANKAUER SUCT BULB TIP NO VENT (SUCTIONS) ×3 IMPLANT

## 2013-01-26 NOTE — Interval H&P Note (Signed)
History and Physical Interval Note:  01/26/2013 11:00 AM  Melissa LoronJennifer L Haynes  has presented today for surgery, with the diagnosis of hemorrhoids   The various methods of treatment have been discussed with the patient and family. After consideration of risks, benefits and other options for treatment, the patient has consented to  Procedure(s): HEMORRHOIDECTOMY (N/A) as a surgical intervention .  The patient's history has been reviewed, patient examined, no change in status, stable for surgery.  I have reviewed the patient's chart and labs.  Questions were answered to the patient's satisfaction.     Avanthika Dehnert A.

## 2013-01-26 NOTE — Op Note (Signed)
Preoperative diagnosis: Grade 3 prolapsed internal hemorrhoids 3 columns  Postoperative diagnosis: Same  Procedure: 3 column internal and external hemorrhoidectomy  Surgeon: Erroll Luna M.D.  Anesthesia: LMA with local anesthesia  EBL: Less than 50 cc  Specimens: Hemorrhoid tissue  Drains: None  IV fluids:400 cc  Indications for procedure: The patient presents with symptomatic hemorrhoid disease. Options of treatment including medical management, office-based procedures and surgical modalities. All have been discussed with the patient. The patient has  failed medical management and disease too extensive for office based procedures. . They wish to proceed with formal hemorrhoidectomy. Risks, benefits and  alternative therapies with complications of long term expectations discussed.  Marland KitchentchThey agree to proceed with surgery.  Description of procedure: The patient was met in the holding area and questions are answered. The patient was taken back to the operating room and placed upon the operating room table. After induction of LMA anesthesia, the patient was placed lithotomy and appropriate the padded. The anal canal and perineum were prepped and draped in a sterile fashion. Timeout was done and the patient received preoperative antibiotics.  The sphincter tone was normal. Anoscope placed. Sher had 3 column internal and external disease with prolapse. . Harmonic scalpel was used after digital examination to excise hemorrhoid tissue. Internal sphincter was preserved. The mucosal edges were  reapproximated with 3-0 Monocryl and the bottom left open. All 3 columns were treated in a similar fashion. Hemostasis achieved. Irrigation used. No significant narrowing of the anal canal noted.  Internal and external sphincter muscles intact. Surgicel wrapped around Gelfoam used packing. Exparel  20 cc diluted with 20 cc saline used for perianal block.  All final counts are found to be correct. The patient was  taken to lithotomy, extubated and transported to recovery in satisfactory condition.

## 2013-01-26 NOTE — Anesthesia Postprocedure Evaluation (Signed)
Anesthesia Post Note  Patient: Melissa LoronJennifer L Sieck  Procedure(s) Performed: Procedure(s) (LRB): HEMORRHOIDECTOMY (N/A)  Anesthesia type: General  Patient location: PACU  Post pain: Pain level controlled  Post assessment: Patient's Cardiovascular Status Stable  Last Vitals:  Filed Vitals:   01/26/13 1300  BP:   Pulse: 97  Temp:   Resp: 19    Post vital signs: Reviewed and stable  Level of consciousness: alert  Complications: No apparent anesthesia complications

## 2013-01-26 NOTE — Transfer of Care (Signed)
Immediate Anesthesia Transfer of Care Note  Patient: Melissa LoronJennifer L Haynes  Procedure(s) Performed: Procedure(s): HEMORRHOIDECTOMY (N/A)  Patient Location: PACU  Anesthesia Type:General  Level of Consciousness: awake  Airway & Oxygen Therapy: Patient Spontanous Breathing and Patient connected to face mask oxygen  Post-op Assessment: Report given to PACU RN and Post -op Vital signs reviewed and stable  Post vital signs: Reviewed and stable  Complications: No apparent anesthesia complications

## 2013-01-26 NOTE — Anesthesia Preprocedure Evaluation (Signed)
Anesthesia Evaluation  Patient identified by MRN, date of birth, ID band Patient awake    Reviewed: Allergy & Precautions, H&P , NPO status , Patient's Chart, lab work & pertinent test results, reviewed documented beta blocker date and time   Airway Mallampati: II TM Distance: >3 FB Neck ROM: full    Dental   Pulmonary former smoker,  breath sounds clear to auscultation        Cardiovascular negative cardio ROS  + Valvular Problems/Murmurs Rhythm:regular     Neuro/Psych  Headaches, Seizures -,  PSYCHIATRIC DISORDERS Anxiety    GI/Hepatic negative GI ROS, Neg liver ROS,   Endo/Other  negative endocrine ROS  Renal/GU negative Renal ROS  negative genitourinary   Musculoskeletal   Abdominal   Peds  Hematology negative hematology ROS (+)   Anesthesia Other Findings See surgeon's H&P   Reproductive/Obstetrics negative OB ROS                           Anesthesia Physical Anesthesia Plan  ASA: II  Anesthesia Plan: General   Post-op Pain Management:    Induction: Intravenous  Airway Management Planned: LMA  Additional Equipment:   Intra-op Plan:   Post-operative Plan:   Informed Consent: I have reviewed the patients History and Physical, chart, labs and discussed the procedure including the risks, benefits and alternatives for the proposed anesthesia with the patient or authorized representative who has indicated his/her understanding and acceptance.   Dental Advisory Given  Plan Discussed with: CRNA and Surgeon  Anesthesia Plan Comments:         Anesthesia Quick Evaluation

## 2013-01-26 NOTE — Discharge Instructions (Signed)
[facility] _______[facility] ° °RECTAL SURGERY POST OP INSTRUCTIONS: POST OP INSTRUCTIONS ° °Always review your discharge instruction sheet given to you by the facility where your surgery was performed. °IF YOU HAVE DISABILITY OR FAMILY LEAVE FORMS, YOU MUST BRING THEM TO THE OFFICE FOR PROCESSING.   °DO NOT GIVE THEM TO YOUR DOCTOR. ° °1. A  prescription for pain medication may be given to you upon discharge.  Take your pain medication as prescribed, if needed.  If narcotic pain medicine is not needed, then you may take acetaminophen (Tylenol) or ibuprofen (Advil) as needed. °2. Take your usually prescribed medications unless otherwise directed. °3. If you need a refill on your pain medication, please contact your pharmacy.  They will contact our office to request authorization. Prescriptions will not be filled after 5 pm or on week-ends. °4. You should follow a light diet the first 48 hours after arrival home, such as soup and crackers, etc.  Be sure to include lots of fluids daily.  Resume your normal diet 2-3 days after surgery.. °5. Most patients will experience some swelling and discomfort in the rectal area. Ice packs, reclining and warm tub soaks will help.  Swelling and discomfort can take several days to resolve.  °6. It is common to experience some constipation if taking pain medication after surgery.  Increasing fluid intake and taking a stool softener (such as Colace) will usually help or prevent this problem from occurring.  A mild laxative (Milk of Magnesia or Miralax) should be taken according to package directions if there are no bowel movements after 48 hours. °7. Unless discharge instructions indicate otherwise, leave your bandage dry and in place for 24 hours, or remove the bandage if you have a bowel movement. You may notice a small amount of bleeding with bowel movements for the first few days. You may have some packing in the rectum which will come out over the first day or two. You  will need to wear an absorbent pad or soft cotton gauze in your underwear until the drainage stops.it. °8. ACTIVITIES:  You may resume regular (light) daily activities beginning the next day--such as daily self-care, walking, climbing stairs--gradually increasing activities as tolerated.  You may have sexual intercourse when it is comfortable.  Refrain from any heavy lifting or straining until approved by your doctor. °a. You may drive when you are no longer taking prescription pain medication, you can comfortably wear a seatbelt, and you can safely maneuver your car and apply brakes. °b. RETURN TO WORK: : ____________________ °c.  °9. You should see your doctor in the office for a follow-up appointment approximately 2-3 weeks after your surgery.  Make sure that you call for this appointment within a day or two after you arrive home to insure a convenient appointment time. °10. OTHER INSTRUCTIONS:  __________________________________________________________________________________________________________________________________________________________________________________________  °WHEN TO CALL YOUR DOCTOR: °1. Fever over 101.0 °2. Inability to urinate °3. Nausea and/or vomiting °4. Extreme swelling or bruising °5. Continued bleeding from rectum. °6. Increased pain, redness, or drainage from the incision °7. Constipation ° °The clinic staff is available to answer your questions during regular business hours.  Please don’t hesitate to call and ask to speak to one of the nurses for clinical concerns.  If you have a medical emergency, go to the nearest emergency room or call 911.  A surgeon from [facility] is always on call at the hospital ° ° °[address], [address], [place], [place]  [address] ? °   [address] [address], [place], [place]   [address] °[phone] ? [phone] ? FAX [phone] °Web site: [email or web address] ° ° °Post Anesthesia Home Care Instructions ° °Activity: °Get  plenty of rest for the remainder of the day. A responsible adult should stay with you for 24 hours following the procedure.  °For the next 24 hours, DO NOT: °-Drive a car °-Operate machinery °-Drink alcoholic beverages °-Take any medication unless instructed by your physician °-Make any legal decisions or sign important papers. ° °Meals: °Start with liquid foods such as gelatin or soup. Progress to regular foods as tolerated. Avoid greasy, spicy, heavy foods. If nausea and/or vomiting occur, drink only clear liquids until the nausea and/or vomiting subsides. Call your physician if vomiting continues. ° °Special Instructions/Symptoms: °Your throat may feel dry or sore from the anesthesia or the breathing tube placed in your throat during surgery. If this causes discomfort, gargle with warm salt water. The discomfort should disappear within 24 hours. ° °Information for Discharge Teaching: °EXPAREL (bupivacaine liposome injectable suspension)  ° °Your surgeon gave you EXPAREL(bupivacaine) in your surgical incision to help control your pain after surgery.  °· EXPAREL is a local anesthetic that provides pain relief by numbing the tissue around the surgical site. °· EXPAREL is designed to release pain medication over time and can control pain for up to 72 hours. °· Depending on how you respond to EXPAREL, you may require less pain medication during your recovery. ° °Possible side effects: °· Temporary loss of sensation or ability to move in the area where bupivacaine was injected. °· Nausea, vomiting, constipation °· Rarely, numbness and tingling in your mouth or lips, lightheadedness, or anxiety may occur. °· Call your doctor right away if you think you may be experiencing any of these sensations, or if you have other questions regarding possible side effects. ° °Follow all other discharge instructions given to you by your surgeon or nurse. Eat a healthy diet and drink plenty of water or other fluids. ° °If you return to  the hospital for any reason within 96 hours following the administration of EXPAREL, please inform your health care providers. ° °

## 2013-01-26 NOTE — H&P (View-Only) (Signed)
Patient ID: Melissa Haynes, female   DOB: 11-23-1975, 38 y.o.   MRN: [id number]  Chief Complaint  Patient presents with  . Routine Post Op    rectal bleeding    HPI Melissa Haynes is a 38 y.o. female.  Patient sent at the request of Dr. Waynard Edwards for internal hemorrhoid disease. She has had a history of rectal bleeding and prolapse of tissue for the last 14 years since the birth of her role as child. She has mild to moderate discomfort with some burning and itching and rectal bleeding. She relates a history of right flank pain. This was evaluated by CT scanning which showed a foreign body or GI tract. She's not sure she is passed this but had some rectal bleeding which was evaluated in the emergency room and felt to be consistent with hemorrhoid bleeding. Most of this is when she wipes. She has not had constipation or more strain often.she is developed increased bleeding 3 weeks ago after sexual intercourse.. Patient denies anal intercourse. She called the on-call doctor for consultation. The bleeding stopped after about a week. It was intermittent and noticed after bowel movements. Is not continuous. It was dark in nature. It is now resolved.  HPI  Past Medical History  Diagnosis Date  . Heart murmur   . Seizures     Past Surgical History  Procedure Laterality Date  . Novasure ablation    . Dilation and curettage of uterus      Family History  Problem Relation Age of Onset  . Cancer Father     pancreatic, lung    Social History History  Substance Use Topics  . Smoking status: Former Smoker    Quit date: 10/03/2010  . Smokeless tobacco: Never Used  . Alcohol Use: No    No Known Allergies  Current Outpatient Prescriptions  Medication Sig Dispense Refill  . clonazePAM (KLONOPIN) 0.5 MG tablet Take 0.5 mg by mouth daily as needed for anxiety.      Marland Kitchen HYDROcodone-acetaminophen (NORCO/VICODIN) 5-325 MG per tablet Take 1 tablet by mouth as needed.      . nortriptyline (PAMELOR)  25 MG capsule Take 50 mg by mouth daily.      . RELPAX 40 MG tablet       . topiramate (TOPAMAX) 200 MG tablet Take 100 mg by mouth 2 (two) times daily.      . hydrocortisone-pramoxine (PROCTOFOAM HC) rectal foam Place 1 applicator rectally 2 (two) times daily.  10 g  0   No current facility-administered medications for this visit.    Review of Systems Review of Systems  Constitutional: Negative for fever, chills and unexpected weight change.  HENT: Negative for congestion, hearing loss, sore throat, trouble swallowing and voice change.   Eyes: Negative for visual disturbance.  Respiratory: Negative for cough and wheezing.   Cardiovascular: Negative for chest pain, palpitations and leg swelling.  Gastrointestinal: Negative for nausea, vomiting, abdominal pain, diarrhea, constipation, blood in stool, abdominal distention and anal bleeding.  Genitourinary: Negative for hematuria, vaginal bleeding and difficulty urinating.  Musculoskeletal: Negative for arthralgias.  Skin: Negative for rash and wound.  Neurological: Negative for seizures, syncope and headaches.  Hematological: Negative for adenopathy. Does not bruise/bleed easily.  Psychiatric/Behavioral: Negative for confusion.    Blood pressure 132/82, pulse 112, temperature 98 F (36.7 C), resp. rate 18, height 5\' 4"  (1.626 m), weight 135 lb (61.236 kg).  Physical Exam Physical Exam  Constitutional: She appears well-developed.  Pulmonary/Chest: Breath sounds  normal. No respiratory distress.  Genitourinary: grade 3 hemorrhoids noted. Mild prolapse noted. Reducible but friable. Tone  normal. Anoscopy: 3 column grade 3 congested internal hemorrhoid disease. Recommended sclerotherapy to two of these columns since they appeared to have been bleeding at some point. Discussed the procedure with the patient of sclerotherapy. She agreed to proceed after risks and benefits of the procedure were discussed. 1 cc of sclerosant was injected into  the right posterior column and left lateral column since the 2 had signs of recent bleeding and were friable. She tolerated the procedure well.   Musculoskeletal: Normal range of motion.  Neurological: She is alert.  Skin: Skin is warm and dry.  Psychiatric: She has a normal mood and affect. Her behavior is normal. Thought content normal.    Data Reviewed CT abdomen pelvis  Assessment    Grade 3 internal and external hemorrhoid disease with symptoms of increased bleeding    Plan    Discussed medical and surgical approaches to her problem. Discussed pathophysiology with her today. Sclerotherapy used with the hope of decreasing her bleeding episodes but she will require hemorrhoidectomy for definitive treatment. Proctofoam can be used in the meantime history down the tissue and hopefully keep her bleeding episodes minimal. Discussed medical approaches to her problem. Discussed hemorrhoidectomy with her as well as the pros and cons. Complications discussed. Postoperative recovery and work limitations discussed. She is ready for hemorrhoidectomy at this point in time.The procedure has been discussed with the patient.  Alternative therapies have been discussed with the patient.  Operative risks include bleeding,  Infection,  Organ injury,  Nerve injury,anal stenosis, recurrence,  Blood vessel injury,  DVT,  Pulmonary embolism,  Death,  And possible reoperation.  Medical management risks include worsening of present situation.  The success of the procedure is 50 -90 % at treating patients symptoms.  The patient understands and agrees to proceed.       Melissa Haynes A. 12/28/2012, 5:00 PM

## 2013-01-26 NOTE — Anesthesia Procedure Notes (Addendum)
Procedure Name: LMA Insertion Date/Time: 01/26/2013 11:28 AM Performed by: Zenia ResidesPAYNE, Graiden Henes D Pre-anesthesia Checklist: Patient identified, Emergency Drugs available, Suction available and Patient being monitored Patient Re-evaluated:Patient Re-evaluated prior to inductionOxygen Delivery Method: Circle System Utilized Preoxygenation: Pre-oxygenation with 100% oxygen Intubation Type: IV induction Ventilation: Mask ventilation without difficulty LMA: LMA inserted LMA Size: 4.0 Number of attempts: 1 Airway Equipment and Method: bite block Placement Confirmation: positive ETCO2 and breath sounds checked- equal and bilateral Tube secured with: Tape Dental Injury: Teeth and Oropharynx as per pre-operative assessment

## 2013-01-27 ENCOUNTER — Encounter (HOSPITAL_BASED_OUTPATIENT_CLINIC_OR_DEPARTMENT_OTHER): Payer: Self-pay | Admitting: Surgery

## 2013-01-27 ENCOUNTER — Telehealth (INDEPENDENT_AMBULATORY_CARE_PROVIDER_SITE_OTHER): Payer: Self-pay

## 2013-01-27 NOTE — Telephone Encounter (Signed)
Take 17 grams daily not entire bottle

## 2013-01-27 NOTE — Telephone Encounter (Signed)
Pts caregiver notified that Per Dr Luisa Hartornett pt should only take 17 gm of miralax daily. He states he understands.

## 2013-01-27 NOTE — Telephone Encounter (Signed)
Pt care giver called wanting to verify the dose on miralax. He states instructions indicated the whole bottle 255gm and not 17 gm that is indicated on bottle. He wanted to be sure Dr Luisa Hartornett wants pt to take the whole bottle at one time. I advised pt I will send this question to Dr Luisa Hartornett and Marcelino DusterMichelle. Return call # is [phone].

## 2013-02-01 ENCOUNTER — Telehealth (INDEPENDENT_AMBULATORY_CARE_PROVIDER_SITE_OTHER): Payer: Self-pay

## 2013-02-01 NOTE — Telephone Encounter (Signed)
Patient called in tearful because she is using the bathroom nonstop. She states she is having a couple watery stools every hour.  She states she cannot wipe anymore because her bottom is raw and she has to get in bath to clean herself. She stopped the miralax last night. Advised for her to try and take some imodium to slow down her BM's but that she needs to be careful so she doesn't get constipated. She will try the imodium and call back and let us know how she is doing tomorrow. Advised I would let Dr Luisa Hartornett know and call her back if he suggests anything else for her to try.

## 2013-02-02 NOTE — Telephone Encounter (Signed)
Called patient to make her aware that prescription is at the front desk ready for pickup

## 2013-02-02 NOTE — Telephone Encounter (Signed)
Called pt to check on her today. She took half dose of imodium last night and her BM's have slowed down. She has had 1 BM this morning and is doing better than yesterday. She knows not to take any more imodium or miralax.. She is using the lidocaine jelly. She is requesting refill of percocet. Ok to refill?

## 2013-02-02 NOTE — Telephone Encounter (Signed)
Stop miralax.  Apply the lidocaine jelly  Twice a day.

## 2013-02-11 ENCOUNTER — Encounter (INDEPENDENT_AMBULATORY_CARE_PROVIDER_SITE_OTHER): Payer: Self-pay

## 2013-02-11 ENCOUNTER — Ambulatory Visit (INDEPENDENT_AMBULATORY_CARE_PROVIDER_SITE_OTHER): Payer: 59 | Admitting: Surgery

## 2013-02-11 ENCOUNTER — Encounter (INDEPENDENT_AMBULATORY_CARE_PROVIDER_SITE_OTHER): Payer: Self-pay | Admitting: Surgery

## 2013-02-11 VITALS — BP 118/72 | HR 108 | Temp 98.2°F | Resp 15 | Ht 62.0 in | Wt 130.8 lb

## 2013-02-11 DIAGNOSIS — Z9889 Other specified postprocedural states: Secondary | ICD-10-CM

## 2013-02-11 MED ORDER — OXYCODONE-ACETAMINOPHEN 5-325 MG PO TABS: 1.0000 | ORAL_TABLET | ORAL | Status: DC | PRN

## 2013-02-11 MED ORDER — LIDOCAINE HCL 2 % EX GEL: 1.0000 "application " | CUTANEOUS | Status: DC | PRN

## 2013-02-11 NOTE — Progress Notes (Signed)
Patient returns 3 weeks after hemorrhoidectomy. She was having some issue with sensation in her anal canal but this is getting better. She had some diarrhea from the MiraLAX she stopped this. Overall she is improving. Pain is tolerable. Some mucous discharge noted from anus. No significant fever or chills.  Exam: Anal canal healing well with minimal fibrinous exudate. Wounds clean.  Impression: Status post three column hemorrhoidectomy  Plan:continue current care. 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 Return in 3 weeks for recheck. Increase activity slowly.

## 2013-02-11 NOTE — Patient Instructions (Signed)
Return 3 weeks.  Will need to be out of work for total of 6 weeks from surgery.

## 2013-03-04 ENCOUNTER — Encounter (INDEPENDENT_AMBULATORY_CARE_PROVIDER_SITE_OTHER): Payer: Self-pay | Admitting: Surgery

## 2013-03-04 ENCOUNTER — Ambulatory Visit (INDEPENDENT_AMBULATORY_CARE_PROVIDER_SITE_OTHER): Payer: 59 | Admitting: Surgery

## 2013-03-04 VITALS — BP 108/66 | HR 84 | Temp 98.6°F | Resp 16 | Ht 62.0 in | Wt 130.6 lb

## 2013-03-04 DIAGNOSIS — Z9889 Other specified postprocedural states: Secondary | ICD-10-CM

## 2013-03-04 NOTE — Patient Instructions (Signed)
RETURN AS NEEDED.  HIGH FIBER DIET.

## 2013-03-04 NOTE — Progress Notes (Signed)
Patient returns 6 weeks after hemorrhoidectomy. . Overall she is improving. Doing much better. No significant fever or chills.  Exam: Anal canal healing well with minimal fibrinous exudate. Wounds clean.  Impression: Status post three column hemorrhoidectomy  Plan:continue current care. Return AS NEEDED.

## 2014-05-17 ENCOUNTER — Emergency Department (HOSPITAL_COMMUNITY)
Admission: EM | Admit: 2014-05-17 | Discharge: 2014-05-17 | Disposition: A | Discharge status: Home / Self Care | Payer: 59 | Attending: Emergency Medicine | Admitting: Emergency Medicine

## 2014-05-17 ENCOUNTER — Encounter (HOSPITAL_COMMUNITY): Payer: Self-pay

## 2014-05-17 DIAGNOSIS — R112 Nausea with vomiting, unspecified: Secondary | ICD-10-CM

## 2014-05-17 DIAGNOSIS — Z87891 Personal history of nicotine dependence: Secondary | ICD-10-CM | POA: Insufficient documentation

## 2014-05-17 DIAGNOSIS — A084 Viral intestinal infection, unspecified: Secondary | ICD-10-CM | POA: Insufficient documentation

## 2014-05-17 DIAGNOSIS — R197 Diarrhea, unspecified: Secondary | ICD-10-CM

## 2014-05-17 DIAGNOSIS — Z8719 Personal history of other diseases of the digestive system: Secondary | ICD-10-CM | POA: Insufficient documentation

## 2014-05-17 DIAGNOSIS — Z3202 Encounter for pregnancy test, result negative: Secondary | ICD-10-CM | POA: Insufficient documentation

## 2014-05-17 DIAGNOSIS — Z8659 Personal history of other mental and behavioral disorders: Secondary | ICD-10-CM | POA: Insufficient documentation

## 2014-05-17 DIAGNOSIS — G40909 Epilepsy, unspecified, not intractable, without status epilepticus: Secondary | ICD-10-CM | POA: Insufficient documentation

## 2014-05-17 DIAGNOSIS — Z90710 Acquired absence of both cervix and uterus: Secondary | ICD-10-CM | POA: Insufficient documentation

## 2014-05-17 DIAGNOSIS — R011 Cardiac murmur, unspecified: Secondary | ICD-10-CM | POA: Insufficient documentation

## 2014-05-17 DIAGNOSIS — G43909 Migraine, unspecified, not intractable, without status migrainosus: Secondary | ICD-10-CM | POA: Insufficient documentation

## 2014-05-17 DIAGNOSIS — Z79899 Other long term (current) drug therapy: Secondary | ICD-10-CM | POA: Insufficient documentation

## 2014-05-17 LAB — CBC WITH DIFFERENTIAL/PLATELET
Basophils Absolute: 0 10*3/uL (ref 0.0–0.1)
Basophils Relative: 0 % (ref 0–1)
Eosinophils Absolute: 0 10*3/uL (ref 0.0–0.7)
Eosinophils Relative: 0 % (ref 0–5)
HCT: 42.2 % (ref 36.0–46.0)
HEMOGLOBIN: 14.7 g/dL (ref 12.0–15.0)
Lymphocytes Relative: 13 % (ref 12–46)
Lymphs Abs: 1 10*3/uL (ref 0.7–4.0)
MCH: 31.6 pg (ref 26.0–34.0)
MCHC: 34.8 g/dL (ref 30.0–36.0)
MCV: 90.8 fL (ref 78.0–100.0)
MONOS PCT: 3 % (ref 3–12)
Monocytes Absolute: 0.2 10*3/uL (ref 0.1–1.0)
NEUTROS ABS: 6.8 10*3/uL (ref 1.7–7.7)
NEUTROS PCT: 84 % — AB (ref 43–77)
Platelets: 302 10*3/uL (ref 150–400)
RBC: 4.65 MIL/uL (ref 3.87–5.11)
RDW: 12.3 % (ref 11.5–15.5)
WBC: 8.1 10*3/uL (ref 4.0–10.5)

## 2014-05-17 LAB — LIPASE, BLOOD: Lipase: 26 U/L (ref 11–59)

## 2014-05-17 LAB — COMPREHENSIVE METABOLIC PANEL
ALT: 29 U/L (ref 0–35)
AST: 21 U/L (ref 0–37)
Albumin: 4.5 g/dL (ref 3.5–5.2)
Alkaline Phosphatase: 36 U/L — ABNORMAL LOW (ref 39–117)
Anion gap: 10 (ref 5–15)
BILIRUBIN TOTAL: 0.9 mg/dL (ref 0.3–1.2)
BUN: 14 mg/dL (ref 6–23)
CO2: 20 mmol/L (ref 19–32)
Calcium: 9.5 mg/dL (ref 8.4–10.5)
Chloride: 109 mmol/L (ref 96–112)
Creatinine, Ser: 0.89 mg/dL (ref 0.50–1.10)
GFR calc Af Amer: >90 mL/min (ref >90)
GFR, EST NON AFRICAN AMERICAN: 82 mL/min — AB (ref >90)
GLUCOSE: 103 mg/dL — AB (ref 70–99)
POTASSIUM: 4.1 mmol/L (ref 3.5–5.1)
SODIUM: 139 mmol/L (ref 135–145)
Total Protein: 7.2 g/dL (ref 6.0–8.3)

## 2014-05-17 LAB — POC URINE PREG, ED: PREG TEST UR: NEGATIVE

## 2014-05-17 MED ORDER — ONDANSETRON HCL 4 MG PO TABS: 4.0000 mg | ORAL_TABLET | Freq: Three times a day (TID) | ORAL | Status: DC | PRN

## 2014-05-17 MED ORDER — ONDANSETRON HCL 4 MG/2ML IJ SOLN
4.0000 mg | Freq: Once | INTRAMUSCULAR | Status: AC
  Administered 2014-05-17: 4 mg via INTRAVENOUS
  Filled 2014-05-17: qty 2

## 2014-05-17 MED ORDER — SODIUM CHLORIDE 0.9 % IV BOLUS (SEPSIS)
1000.0000 mL | Freq: Once | INTRAVENOUS | Status: AC
  Administered 2014-05-17: 1000 mL via INTRAVENOUS

## 2014-05-17 MED ORDER — SODIUM CHLORIDE 0.9 % IV BOLUS (SEPSIS): 1000.0000 mL | Freq: Once | INTRAVENOUS | Status: DC

## 2014-05-17 NOTE — ED Notes (Signed)
Pt started having mid lower abd pain on Monday and vomiting. Unable to keep liquids down. Denies any diarrhea.

## 2014-05-17 NOTE — ED Provider Notes (Signed)
CSN: [id number]     Arrival date & time 05/17/14  1151 History   First MD Initiated Contact with Patient 05/17/14 1543     Chief Complaint  Patient presents with  . Abdominal Pain  . Emesis     (Consider location/radiation/quality/duration/timing/severity/associated sxs/prior Treatment) Patient is a 39 y.o. female presenting with vomiting. The history is provided by the patient. No language interpreter was used.  Emesis Severity:  Moderate Timing:  Intermittent Number of daily episodes:  10 Quality:  Stomach contents Progression:  Unchanged Chronicity:  New Recent urination:  Normal Relieved by:  None tried Worsened by:  Nothing tried Ineffective treatments:  None tried Associated symptoms: abdominal pain and diarrhea   Associated symptoms: no chills, no cough, no fever, no headaches, no myalgias, no sore throat and no URI   Risk factors: no diabetes, no prior abdominal surgery, no sick contacts and no travel to endemic areas     Past Medical History  Diagnosis Date  . Generalized anxiety disorder   . Hemorrhoids 12/2012  . Heart murmur     states no problems; has never had any cardiac testing  . Tachycardia     states heart rate normally 120-130; no cardiologist; no meds.  . Nonproductive cough 01/19/2013  . Dental bridge present     upper  . Seizures     due to anxiety disorder - last seizure > 1 yr. ago  . Migraines    Past Surgical History  Procedure Laterality Date  . Novasure ablation    . Dilation and curettage of uterus    . Tonsillectomy  1986  . Hemorrhoid surgery N/A 01/26/2013    Procedure: HEMORRHOIDECTOMY;  Surgeon: Clovis Puhomas A. Cornett, MD;  Location: [facility];  Service: General;  Laterality: N/A;   Family History  Problem Relation Age of Onset  . Cancer Father     pancreatic, lung   History  Substance Use Topics  . Smoking status: Former Smoker    Quit date: 11/20/2010  . Smokeless tobacco: Never Used  . Alcohol Use: Yes   Comment: occasionally   OB History    No data available     Review of Systems  Constitutional: Negative for fever, chills and fatigue.  HENT: Negative for sore throat.   Respiratory: Negative for cough, chest tightness and shortness of breath.   Cardiovascular: Negative for chest pain.  Gastrointestinal: Positive for nausea, vomiting, abdominal pain and diarrhea. Negative for blood in stool.  Genitourinary: Negative for vaginal discharge and menstrual problem.  Musculoskeletal: Negative for myalgias.  Neurological: Negative for weakness, light-headedness and headaches.  Psychiatric/Behavioral: Negative for confusion.  All other systems reviewed and are negative.     Allergies  Review of patient's allergies indicates no known allergies.  Home Medications   Prior to Admission medications   Medication Sig Start Date End Date Taking? Authorizing Provider  clonazePAM (KLONOPIN) 0.5 MG tablet Take 0.5 mg by mouth daily as needed for anxiety.    Historical Provider, MD  ibuprofen (ADVIL) 200 MG tablet Take 3 tablets (600 mg total) by mouth every 6 (six) hours as needed. 01/26/13   Harriette Bouillonhomas Cornett, MD  oxyCODONE-acetaminophen (ROXICET) 5-325 MG per tablet Take 1 tablet by mouth every 4 (four) hours as needed. 02/11/13   Harriette Bouillonhomas Cornett, MD  RELPAX 40 MG tablet Take 40 mg by mouth every 2 (two) hours as needed.  08/17/12   Historical Provider, MD  topiramate (TOPAMAX) 200 MG tablet Take 100 mg by mouth  2 (two) times daily.    Historical Provider, MD   BP 104/73 mmHg  Pulse 76  Temp(Src) 98.6 F (37 C) (Oral)  Resp 16  Ht  (1.575 m)  Wt 135 lb (61.236 kg)  BMI 24.69 kg/m2  SpO2 100% Physical Exam  Constitutional: She appears well-developed and well-nourished. She does not appear ill. No distress.  HENT:  Head: Normocephalic and atraumatic.  Nose: Nose normal.  Mouth/Throat: Oropharynx is clear and moist. No oropharyngeal exudate.  Eyes: EOM are normal. Pupils are equal, round,  and reactive to light.  Neck: Normal range of motion. Neck supple.  Cardiovascular: Normal rate, regular rhythm, normal heart sounds and intact distal pulses.   No murmur heard. Pulmonary/Chest: Effort normal and breath sounds normal. No respiratory distress. She has no wheezes. She exhibits no tenderness.  Abdominal: Soft. There is no tenderness. There is no rebound and no guarding.  Soft, nondistended, nontender.  No guarding.  Able to change positions easily, no pain with shaking the bed  Musculoskeletal: Normal range of motion. She exhibits no tenderness.  Lymphadenopathy:    She has no cervical adenopathy.  Neurological: She is alert. No cranial nerve deficit. Coordination normal.  Skin: Skin is warm and dry. She is not diaphoretic.  Psychiatric: She has a normal mood and affect. Her behavior is normal. Judgment and thought content normal.  Nursing note and vitals reviewed.   ED Course  Procedures (including critical care time) Labs Review Labs Reviewed  CBC WITH DIFFERENTIAL/PLATELET - Abnormal; Notable for the following:    Neutrophils Relative % 84 (*)    All other components within normal limits  COMPREHENSIVE METABOLIC PANEL - Abnormal; Notable for the following:    Glucose, Bld 103 (*)    Alkaline Phosphatase 36 (*)    GFR calc non Af Amer 82 (*)    All other components within normal limits  LIPASE, BLOOD  POC URINE PREG, ED   Imaging Review No results found.   EKG Interpretation None      MDM   Final diagnoses:  Nausea vomiting and diarrhea  Viral gastroenteritis   Pt is a 39 yo F with hx of seizures (on topamax, last seizure was several months ago) and anxiety who presents with several days of N/V/D.  Reports 2 days of recurrent episodes of NBNB emesis.  Has been unable to tolerate much PO intake at all without vomiting soon after.  Today developed loose stool and has had 2 episodes of diarrhea today.  Complains of periumbilical cramping abdominal pain prior  to episodes of emesis or diarrhea but no abd pain when she is not retching.  She has been afebrile.  No recent travel, no known sick contacts.  Denies urinary frequency, urgency, dysuria, no vaginal discharge.  Is sexually active, with husband who has had a vasectomy.   Will give a NS bolus and IV zofran.   Afebrile.  Labs are reassuring.  No leukocytosis.   UPT negative.    Likely viral gastroenteritis.  Doubt appendicitis or pancreatitis based on exam.  Denies urinary/GU sx, so doubt other underlying infectious etiology.   Treated symptomatically and sx improved.  Now tolerating PO intake (given sprite and crackers here).   Will dc home with zofran.  Advised to stay well hydrated and eat easily digestible foods for the next few days.  Encouraged to return to the ED if she develops significant abd pain, has inability to tolerate PO intake, or with any other concerns.  All questions were answered and pt was discharged in stable condition.   Patient was seen with ED Attending, Dr. Rogers Blocker, MD    Lenell Antu, MD 05/18/14 1421  Blake Divine, MD 05/18/14 [phone]

## 2014-05-17 NOTE — ED Notes (Signed)
Pt is aware urine is needed for testing. Pt is unable to urinate at this time.  

## 2014-05-17 NOTE — ED Provider Notes (Signed)
I saw and evaluated the patient, reviewed the resident's note and I agree with the findings and plan.   EKG Interpretation None      38 yo female with two day29s of vomiting, now with diarrhea.  On exam, well appearing, nontoxic, not distressed, normal respiratory effort, normal perfusion, abdomen very soft, minimal tenderness in suprapubic and RLQ regions, no r/r/g.  Labs reassuring.  Feels better after zofran and fluids.  Likely has self limited vomiting diarrheal illness.  Given return precautions.  DC'd.  Clinical Impression: 1. Nausea vomiting and diarrhea       Blake DivineJohn Chonda Baney, MD 05/18/14 [phone]

## 2016-03-05 ENCOUNTER — Other Ambulatory Visit: Payer: Self-pay | Admitting: Obstetrics and Gynecology

## 2016-03-05 DIAGNOSIS — N62 Hypertrophy of breast: Secondary | ICD-10-CM

## 2016-03-12 ENCOUNTER — Ambulatory Visit
Admission: RE | Admit: 2016-03-12 | Discharge: 2016-03-12 | Disposition: A | Discharge status: Home / Self Care | Payer: 59 | Source: Ambulatory Visit | Attending: Obstetrics and Gynecology | Admitting: Obstetrics and Gynecology

## 2016-03-12 ENCOUNTER — Other Ambulatory Visit: Payer: Self-pay | Admitting: Obstetrics and Gynecology

## 2016-03-12 DIAGNOSIS — N62 Hypertrophy of breast: Secondary | ICD-10-CM

## 2016-04-21 ENCOUNTER — Emergency Department (HOSPITAL_COMMUNITY)
Admission: EM | Admit: 2016-04-21 | Discharge: 2016-04-21 | Disposition: A | Discharge status: Home / Self Care | Payer: 59 | Attending: Emergency Medicine | Admitting: Emergency Medicine

## 2016-04-21 ENCOUNTER — Emergency Department (HOSPITAL_COMMUNITY): Payer: 59

## 2016-04-21 ENCOUNTER — Encounter (HOSPITAL_COMMUNITY): Payer: Self-pay

## 2016-04-21 DIAGNOSIS — G43001 Migraine without aura, not intractable, with status migrainosus: Secondary | ICD-10-CM | POA: Diagnosis not present

## 2016-04-21 DIAGNOSIS — G43909 Migraine, unspecified, not intractable, without status migrainosus: Secondary | ICD-10-CM | POA: Diagnosis present

## 2016-04-21 DIAGNOSIS — Z87891 Personal history of nicotine dependence: Secondary | ICD-10-CM | POA: Insufficient documentation

## 2016-04-21 MED ORDER — METOCLOPRAMIDE HCL 5 MG/ML IJ SOLN
5.0000 mg | Freq: Once | INTRAMUSCULAR | Status: AC
  Administered 2016-04-21: 5 mg via INTRAVENOUS
  Filled 2016-04-21: qty 2

## 2016-04-21 MED ORDER — DIPHENHYDRAMINE HCL 50 MG/ML IJ SOLN
25.0000 mg | Freq: Once | INTRAMUSCULAR | Status: AC
Start: 2016-04-21 — End: 2016-04-21
  Administered 2016-04-21: 25 mg via INTRAVENOUS
  Filled 2016-04-21: qty 1

## 2016-04-21 NOTE — ED Provider Notes (Signed)
[facility]-EMERGENCY DEPT Provider Note   CSN: [id number] Arrival date & time: 04/21/16  1300     History   Chief Complaint Chief Complaint  Patient presents with  . Migraine    HPI Melissa Haynes is a 41 y.o. female.Complains of headache onset 3 days ago gradual which started at the occipital region and has moved to the top of her head. Feels like migraines that she gets approximately once per week. Associated symptoms include difficulty speaking earlier today which has since resolved. She also reports vomiting one time yesterday though no nausea present. And had stumbling gait yesterday. Presently asymptomatic except for headache which she states is moderate. She treated herself with Flexeril, hydrocodone and Relpax without relief. Nothing makes symptoms better or worse. No other associated symptoms.  HPI  Past Medical History:  Diagnosis Date  . Dental bridge present    upper  . Generalized anxiety disorder   . Heart murmur    states no problems; has never had any cardiac testing  . Hemorrhoids 12/2012  . Migraines   . Nonproductive cough 01/19/2013  . Seizures (HCC)    due to anxiety disorder - last seizure > 1 yr. ago  . Tachycardia    states heart rate normally 120-130; no cardiologist; no meds.    Patient Active Problem List   Diagnosis Date Noted  . Post-operative state 03/04/2013  . Hemorrhoids 11/01/2012    Past Surgical History:  Procedure Laterality Date  . DILATION AND CURETTAGE OF UTERUS    . HEMORRHOID SURGERY N/A 01/26/2013   Procedure: HEMORRHOIDECTOMY;  Surgeon: Clovis Pu. Cornett, MD;  Location: [facility];  Service: General;  Laterality: N/A;  . NOVASURE ABLATION    . TONSILLECTOMY  1986    OB History    No data available       Home Medications    Prior to Admission medications   Medication Sig Start Date End Date Taking? Authorizing Provider  clonazePAM (KLONOPIN) 0.5 MG tablet Take 0.5 mg by mouth daily as needed for  anxiety.   Yes Historical Provider, MD  cyclobenzaprine (FLEXERIL) 10 MG tablet Take 10 mg by mouth daily as needed for muscle spasms.   Yes Historical Provider, MD  ibuprofen (ADVIL) 200 MG tablet Take 3 tablets (600 mg total) by mouth every 6 (six) hours as needed. 01/26/13  Yes Harriette Bouillon, MD  ondansetron (ZOFRAN) 4 MG tablet Take 1 tablet (4 mg total) by mouth every 8 (eight) hours as needed for nausea or vomiting. 05/17/14  Yes Lenell Antu, MD  oxyCODONE-acetaminophen (ROXICET) 5-325 MG per tablet Take 1 tablet by mouth every 4 (four) hours as needed. 02/11/13  Yes Harriette Bouillon, MD  RELPAX 40 MG tablet Take 40 mg by mouth every 2 (two) hours as needed for migraine.  08/17/12  Yes Historical Provider, MD  topiramate (TOPAMAX) 200 MG tablet Take 250 mg by mouth 2 (two) times daily.    Yes Historical Provider, MD  vitamin B-12 (CYANOCOBALAMIN) 100 MCG tablet Take 100 mcg by mouth daily.   Yes Historical Provider, MD    Family History Family History  Problem Relation Age of Onset  . Cancer Father     pancreatic, lung    Social History Social History  Substance Use Topics  . Smoking status: Former Smoker    Quit date: 11/20/2010  . Smokeless tobacco: Never Used  . Alcohol use Yes     Comment: occasionally  Denies alcohol use denies drug use  Allergies   Frovatriptan   Review of Systems Review of Systems  Constitutional: Negative.   HENT: Negative.   Respiratory: Negative.   Cardiovascular: Negative.   Gastrointestinal: Negative.   Genitourinary:       Amenorrheic for 3 years  Musculoskeletal: Negative.   Skin: Negative.   Neurological: Positive for speech difficulty and headaches.       Stumbling gait  Psychiatric/Behavioral: Negative.   All other systems reviewed and are negative.    Physical Exam Updated Vital Signs BP (!) 95/29   Pulse 88   Temp 98.9 F (37.2 C) (Oral)   Resp 19   SpO2 99%   Physical Exam  Constitutional: She is oriented to person,  place, and time. She appears well-developed and well-nourished. No distress.  HENT:  Head: Normocephalic and atraumatic.  Eyes: Conjunctivae are normal. Pupils are equal, round, and reactive to light.  Neck: Neck supple. No tracheal deviation present. No thyromegaly present.  Cardiovascular: Normal rate and regular rhythm.   No murmur heard. Pulmonary/Chest: Effort normal and breath sounds normal.  Abdominal: Soft. Bowel sounds are normal. She exhibits no distension. There is no tenderness.  Musculoskeletal: Normal range of motion. She exhibits no edema or tenderness.  Neurological: She is alert and oriented to person, place, and time. No cranial nerve deficit. Coordination normal.  Speech clear. Gait normal Romberg normal pronator drift normal finger to nose normal DTR symmetric bilaterally at knee jerk ankle jerk biceps toes downward going bilaterally  Skin: Skin is warm and dry. No rash noted.  Psychiatric: She has a normal mood and affect.  Nursing note and vitals reviewed.    ED Treatments / Results  Labs (all labs ordered are listed, but only abnormal results are displayed) Labs Reviewed - No data to display  EKG  EKG Interpretation None       Radiology No results found.  Procedures Procedures (including critical care time)  Medications Ordered in ED Medications  metoCLOPramide (REGLAN) injection 5 mg (not administered)  diphenhydrAMINE (BENADRYL) injection 25 mg (not administered)    Results for orders placed or performed during the hospital encounter of 05/17/14  CBC with Differential  Result Value Ref Range   WBC 8.1 4.0 - 10.5 K/uL   RBC 4.65 3.87 - 5.11 MIL/uL   Hemoglobin 14.7 12.0 - 15.0 g/dL   HCT 16.1 09.6 - 04.5 %   MCV 90.8 78.0 - 100.0 fL   MCH 31.6 26.0 - 34.0 pg   MCHC 34.8 30.0 - 36.0 g/dL   RDW 40.9 81.1 - 91.4 %   Platelets 302 150 - 400 K/uL   Neutrophils Relative % 84 (H) 43 - 77 %   Neutro Abs 6.8 1.7 - 7.7 K/uL   Lymphocytes Relative  13 12 - 46 %   Lymphs Abs 1.0 0.7 - 4.0 K/uL   Monocytes Relative 3 3 - 12 %   Monocytes Absolute 0.2 0.1 - 1.0 K/uL   Eosinophils Relative 0 0 - 5 %   Eosinophils Absolute 0.0 0.0 - 0.7 K/uL   Basophils Relative 0 0 - 1 %   Basophils Absolute 0.0 0.0 - 0.1 K/uL  Comprehensive metabolic panel  Result Value Ref Range   Sodium 139 135 - 145 mmol/L   Potassium 4.1 3.5 - 5.1 mmol/L   Chloride 109 96 - 112 mmol/L   CO2 20 19 - 32 mmol/L   Glucose, Bld 103 (H) 70 - 99 mg/dL   BUN 14 6 - 23 mg/dL  Creatinine, Ser 0.89 0.50 - 1.10 mg/dL   Calcium 9.5 8.4 - 16.1 mg/dL   Total Protein 7.2 6.0 - 8.3 g/dL   Albumin 4.5 3.5 - 5.2 g/dL   AST 21 0 - 37 U/L   ALT 29 0 - 35 U/L   Alkaline Phosphatase 36 (L) 39 - 117 U/L   Total Bilirubin 0.9 0.3 - 1.2 mg/dL   GFR calc non Af Amer 82 (L) >90 mL/min   GFR calc Af Amer >90 >90 mL/min   Anion gap 10 5 - 15  Lipase, blood  Result Value Ref Range   Lipase 26 11 - 59 U/L  POC urine preg, ED (not at [facility])  Result Value Ref Range   Preg Test, Ur NEGATIVE NEGATIVE   Mr Brain Wo Contrast  Result Date: 04/21/2016 CLINICAL DATA:  Migraine headaches and aphasia. EXAM: MRI HEAD WITHOUT CONTRAST TECHNIQUE: Multiplanar, multiecho pulse sequences of the brain and surrounding structures were obtained without intravenous contrast. COMPARISON:  None. FINDINGS: Brain: No focal diffusion restriction to indicate acute infarct. No intraparenchymal hemorrhage. The brain parenchymal signal is normal. No mass lesion or midline shift. No hydrocephalus or extra-axial fluid collection. The midline structures are normal. No age advanced or lobar predominant atrophy. Vascular: Major intracranial arterial and venous sinus flow voids are preserved. No evidence of chronic microhemorrhage or amyloid angiopathy. Skull and upper cervical spine: The visualized skull base, calvarium, upper cervical spine and extracranial soft tissues are normal. Sinuses/Orbits: No fluid levels or advanced  mucosal thickening. No mastoid effusion. Normal orbits. IMPRESSION: Normal MRI of the brain. Electronically Signed   By: Deatra Robinson M.D.   On: 04/21/2016 21:57   Initial Impression / Assessment and Plan / ED Course  I have reviewed the triage vital signs and the nursing notes.  Pertinent labs & imaging results that were available during my care of the patient were reviewed by me and considered in my medical decision making (see chart for details).     5:30 PM feels improved after treatment with intravenous Reglan and intravenous Benadryl. At 10:15 PM she reports that headache has returned. She declines further pain medicine and feels ready to go home. She states her pain is mild to moderate at present. She is alert Glasgow Coma Score 15 and in no distress.  keep scheduled appointment with neurologist this week Final Clinical Impressions(s) / ED Diagnoses  Diagnosis migraine headache Final diagnoses:  None    New Prescriptions New Prescriptions   No medications on file     Doug Sou, MD 04/21/16 2224

## 2016-04-21 NOTE — Discharge Instructions (Signed)
Keep your scheduled appointment with with your neurologist this week

## 2016-04-21 NOTE — ED Triage Notes (Addendum)
Pt states she woke up this morning with aphasia. Pt has hx of migraines. She also reports she woke up with a pounding headache around 0100. She took topamax last night for headache. Pt alert and oriented, some aphasia noted. Husband states this is improved. She also reports some neck pain into the shoulder.

## 2016-04-21 NOTE — ED Notes (Signed)
Pt stable, understands discharge instructions, and reasons for return.   

## 2017-04-18 ENCOUNTER — Emergency Department (HOSPITAL_COMMUNITY)
Admission: EM | Admit: 2017-04-18 | Discharge: 2017-04-19 | Disposition: A | Discharge status: Home / Self Care | Payer: 59 | Attending: Emergency Medicine | Admitting: Emergency Medicine

## 2017-04-18 ENCOUNTER — Encounter (HOSPITAL_COMMUNITY): Payer: Self-pay

## 2017-04-18 ENCOUNTER — Emergency Department (HOSPITAL_COMMUNITY): Payer: 59

## 2017-04-18 DIAGNOSIS — Z79899 Other long term (current) drug therapy: Secondary | ICD-10-CM | POA: Diagnosis not present

## 2017-04-18 DIAGNOSIS — F17200 Nicotine dependence, unspecified, uncomplicated: Secondary | ICD-10-CM | POA: Insufficient documentation

## 2017-04-18 DIAGNOSIS — R079 Chest pain, unspecified: Secondary | ICD-10-CM | POA: Insufficient documentation

## 2017-04-18 LAB — BASIC METABOLIC PANEL
Anion gap: 10 (ref 5–15)
BUN: 6 mg/dL (ref 6–20)
CHLORIDE: 103 mmol/L (ref 101–111)
CO2: 25 mmol/L (ref 22–32)
Calcium: 9.2 mg/dL (ref 8.9–10.3)
Creatinine, Ser: 0.78 mg/dL (ref 0.44–1.00)
GFR calc non Af Amer: >60 mL/min (ref >60)
Glucose, Bld: 102 mg/dL — ABNORMAL HIGH (ref 65–99)
POTASSIUM: 3.6 mmol/L (ref 3.5–5.1)
SODIUM: 138 mmol/L (ref 135–145)

## 2017-04-18 LAB — I-STAT TROPONIN, ED: Troponin i, poc: 0 ng/mL (ref 0.00–0.08)

## 2017-04-18 LAB — CBC
HEMATOCRIT: 40.1 % (ref 36.0–46.0)
Hemoglobin: 13.3 g/dL (ref 12.0–15.0)
MCH: 31.1 pg (ref 26.0–34.0)
MCHC: 33.2 g/dL (ref 30.0–36.0)
MCV: 93.7 fL (ref 78.0–100.0)
Platelets: 291 10*3/uL (ref 150–400)
RBC: 4.28 MIL/uL (ref 3.87–5.11)
RDW: 12 % (ref 11.5–15.5)
WBC: 9.7 10*3/uL (ref 4.0–10.5)

## 2017-04-18 LAB — I-STAT BETA HCG BLOOD, ED (MC, WL, AP ONLY)

## 2017-04-18 NOTE — ED Triage Notes (Signed)
[facility] "[facility]" Patient states that she has reflux daily but today in right jaw and between shoulder blades. No radiation to arm, no SOB. Patient had IV established PTA and received 324mg  ASA by EMS. No distress on arrival

## 2017-04-19 LAB — D-DIMER, QUANTITATIVE (NOT AT ARMC): D DIMER QUANT: 0.44 ug{FEU}/mL (ref 0.00–0.50)

## 2017-04-19 MED ORDER — PANTOPRAZOLE SODIUM 40 MG IV SOLR
40.0000 mg | INTRAVENOUS | Status: AC
Start: 2017-04-19 — End: 2017-04-19
  Administered 2017-04-19: 40 mg via INTRAVENOUS
  Filled 2017-04-19: qty 40

## 2017-04-19 MED ORDER — GI COCKTAIL ~~LOC~~
30.0000 mL | Freq: Once | ORAL | Status: AC
  Administered 2017-04-19: 30 mL via ORAL

## 2017-04-19 MED ORDER — KETOROLAC TROMETHAMINE 30 MG/ML IJ SOLN
30.0000 mg | Freq: Once | INTRAMUSCULAR | Status: AC
  Administered 2017-04-19: 30 mg via INTRAVENOUS
  Filled 2017-04-19: qty 1

## 2017-04-19 MED ORDER — PANTOPRAZOLE SODIUM 20 MG PO TBEC: 20.0000 mg | DELAYED_RELEASE_TABLET | Freq: Every day | ORAL | 0 refills | Status: DC

## 2017-04-19 NOTE — ED Provider Notes (Signed)
[facility] EMERGENCY DEPARTMENT Provider Note   CSN: [id number] Arrival date & time: 04/18/17  1854     History   Chief Complaint No chief complaint on file.   HPI Melissa Haynes is a 42 y.o. female.   42 year old female with hx of anxiety presents to the emergency department for evaluation of chest pain.  Pain has been constant since before 5 PM today.  She reports most of the pain being located at her mid left shoulder blade.  It will sometimes radiate to her chest, mostly under her left breast.  She has tried Zantac for symptoms without relief and reports shortness of breath characterized as "feeling like I cannot get air in".  Symptoms not specifically worsened with exertion.  No associated fevers, syncope, leg swelling.  She was given 324 mg aspirin by EMS prior to arrival.  No prior hx of ACS.  She is a sporadic smoker.      Past Medical History:  Diagnosis Date  . Dental bridge present    upper  . Generalized anxiety disorder   . Heart murmur    states no problems; has never had any cardiac testing  . Hemorrhoids 12/2012  . Migraines   . Nonproductive cough 01/19/2013  . Seizures (HCC)    due to anxiety disorder - last seizure > 1 yr. ago  . Tachycardia    states heart rate normally 120-130; no cardiologist; no meds.    Patient Active Problem List   Diagnosis Date Noted  . Post-operative state 03/04/2013  . Hemorrhoids 11/01/2012    Past Surgical History:  Procedure Laterality Date  . DILATION AND CURETTAGE OF UTERUS    . HEMORRHOID SURGERY N/A 01/26/2013   Procedure: HEMORRHOIDECTOMY;  Surgeon: Clovis Pu. Cornett, MD;  Location: [facility];  Service: General;  Laterality: N/A;  . NOVASURE ABLATION    . TONSILLECTOMY  1986     OB History   None      Home Medications    Prior to Admission medications   Medication Sig Start Date End Date Taking? Authorizing Provider  clonazePAM (KLONOPIN) 0.5 MG tablet Take 0.5  mg by mouth daily as needed for anxiety.    [provider]  cyclobenzaprine (FLEXERIL) 10 MG tablet Take 10 mg by mouth daily as needed for muscle spasms.    [provider]  ibuprofen (ADVIL) 200 MG tablet Take 3 tablets (600 mg total) by mouth every 6 (six) hours as needed. 01/26/13   Cornett, Maisie Fus, MD  ondansetron (ZOFRAN) 4 MG tablet Take 1 tablet (4 mg total) by mouth every 8 (eight) hours as needed for nausea or vomiting. 05/17/14   Lenell Antu, MD  oxyCODONE-acetaminophen (ROXICET) 5-325 MG per tablet Take 1 tablet by mouth every 4 (four) hours as needed. 02/11/13   Cornett, Maisie Fus, MD  pantoprazole (PROTONIX) 20 MG tablet Take 1 tablet (20 mg total) by mouth daily. 04/19/17   Antony Madura, PA-C  RELPAX 40 MG tablet Take 40 mg by mouth every 2 (two) hours as needed for migraine.  08/17/12   [provider]  topiramate (TOPAMAX) 200 MG tablet Take 250 mg by mouth 2 (two) times daily.     [provider]  vitamin B-12 (CYANOCOBALAMIN) 100 MCG tablet Take 100 mcg by mouth daily.    [provider]    Family History Family History  Problem Relation Age of Onset  . Cancer Father        pancreatic,  lung    Social History Social History   Tobacco Use  . Smoking status: Current Some Day Smoker    Last attempt to quit: 11/20/2010    Years since quitting: 6.4  . Smokeless tobacco: Never Used  Substance Use Topics  . Alcohol use: Yes    Comment: occasionally  . Drug use: No     Allergies   Frovatriptan   Review of Systems Review of Systems Ten systems reviewed and are negative for acute change, except as noted in the HPI.    Physical Exam Updated Vital Signs BP 121/84 (BP Location: Right Arm)   Pulse (!) 104   Temp 98.1 F (36.7 C) (Oral)   Resp 18   SpO2 100%   Physical Exam  Constitutional: She is oriented to person, place, and time. She appears well-developed and well-nourished. No distress.  Nontoxic appearing and in NAD   HENT:  Head: Normocephalic and atraumatic.  Eyes: Conjunctivae and EOM are normal. No scleral icterus.  Neck: Normal range of motion.  Cardiovascular: Normal rate, regular rhythm and intact distal pulses.  Patient not tachycardic as noted in triage  Pulmonary/Chest: Effort normal. No stridor. No respiratory distress. She has no wheezes. She exhibits tenderness (under left breast; no crepitus).  Respirations even and unlabored.  Lungs clear to auscultation bilaterally.  Musculoskeletal: Normal range of motion.  No lower extremity swelling or pitting edema  Neurological: She is alert and oriented to person, place, and time. She exhibits normal muscle tone. Coordination normal.  Skin: Skin is warm and dry. No rash noted. She is not diaphoretic. No erythema. No pallor.  Psychiatric: She has a normal mood and affect. Her behavior is normal.  Nursing note and vitals reviewed.    ED Treatments / Results  Labs (all labs ordered are listed, but only abnormal results are displayed) Labs Reviewed  BASIC METABOLIC PANEL - Abnormal; Notable for the following components:      Result Value   Glucose, Bld 102 (*)    All other components within normal limits  CBC  D-DIMER, QUANTITATIVE (NOT AT [facility])  I-STAT TROPONIN, ED  I-STAT BETA HCG BLOOD, ED ([facility], [facility], [facility] ONLY)    EKG ED ECG REPORT   Date: 04/19/2017  Rate: 97  Rhythm: normal sinus rhythm  QRS Axis: normal  Intervals: normal  ST/T Wave abnormalities: normal  Conduction Disutrbances:none  Narrative Interpretation: Normal sinus rhythm; no STEMI  Old EKG Reviewed: none available  I have personally reviewed the EKG tracing and agree with the computerized printout as noted.   Radiology Dg Chest 2 View  Result Date: 04/18/2017 CLINICAL DATA:  Ongoing reflux. EXAM: CHEST - 2 VIEW COMPARISON:  10/27/2012 FINDINGS: Cardiomediastinal silhouette is normal. Mediastinal contours appear intact. There is no evidence of focal airspace  consolidation, pleural effusion or pneumothorax. Osseous structures are without acute abnormality. Soft tissues are grossly normal. IMPRESSION: No active cardiopulmonary disease. Electronically Signed   By: Ted Mcalpine M.D.   On: 04/18/2017 19:52    Procedures Procedures (including critical care time)  Medications Ordered in ED Medications  gi cocktail (Maalox,Lidocaine,Donnatal) (30 mLs Oral Given 04/19/17 0112)  ketorolac (TORADOL) 30 MG/ML injection 30 mg (30 mg Intravenous Given 04/19/17 0250)  pantoprazole (PROTONIX) injection 40 mg (40 mg Intravenous Given 04/19/17 0251)     Initial Impression / Assessment and Plan / ED Course  I have reviewed the triage vital signs and the nursing notes.  Pertinent labs & imaging results that were available during  my care of the patient were reviewed by me and considered in my medical decision making (see chart for details).     Patient presents to the emergency department for evaluation of chest pain.  Low suspicion for cardiac etiology given reassuring workup today.  EKG is nonischemic and troponin negative.  Patient has a heart score of 1 consistent with low risk of acute coronary event.  Chest x-ray without evidence of mediastinal widening to suggest dissection.  No pneumothorax, pneumonia, pleural effusion.  Pulmonary embolus further considered; however, patient without tachypnea, dyspnea, hypoxia.  She has a negative D dimer in the ED today.  Question musculoskeletal etiology versus reflux.  Will start on Protonix.  She did have mild, temporary improvement following a GI cocktail.  Patient advised to follow-up with her primary care doctor regarding her visit today.  We will also provide referral to cardiology should she desire further outpatient evaluation.  Return precautions discussed and provided. Patient discharged in stable condition with no unaddressed concerns.   Final Clinical Impressions(s) / ED Diagnoses   Final diagnoses:    Chest pain, unspecified type    ED Discharge Orders        Ordered    pantoprazole (PROTONIX) 20 MG tablet  Daily     04/19/17 0201       Antony MaduraHumes, Ladarrion Telfair, PA-C 04/19/17 0355    Ward, Layla MawKristen N, DO 04/19/17 0401

## 2017-04-19 NOTE — Discharge Instructions (Signed)
Your workup in the emergency department today was reassuring and did not reveal a concerning cause of your chest discomfort.  This may be due to reflux and you have been prescribed Protonix to take daily for management.  Try to avoid spicy foods, coffee, chocolate.  We also recommend that you avoid anti-inflammatory medications such as ibuprofen and Aleve.  You may take Tylenol for discomfort as needed.  You have been given a referral to cardiology for additional follow-up, if desired.  We do advise close repeat evaluation by her primary care doctor in the next 1-2 weeks.  You may return to the emergency department for new or concerning symptoms.

## 2017-06-03 DIAGNOSIS — J209 Acute bronchitis, unspecified: Secondary | ICD-10-CM | POA: Diagnosis not present

## 2017-10-01 DIAGNOSIS — J0100 Acute maxillary sinusitis, unspecified: Secondary | ICD-10-CM | POA: Diagnosis not present

## 2017-11-13 DIAGNOSIS — J111 Influenza due to unidentified influenza virus with other respiratory manifestations: Secondary | ICD-10-CM | POA: Diagnosis not present

## 2018-11-17 IMAGING — DX DG CHEST 2V
2 series · 2 of 2 positions shown · non-contrast
Comparison: 10/27/2012

CLINICAL DATA: Ongoing reflux.

EXAM:
CHEST - 2 VIEW

[chest pa]
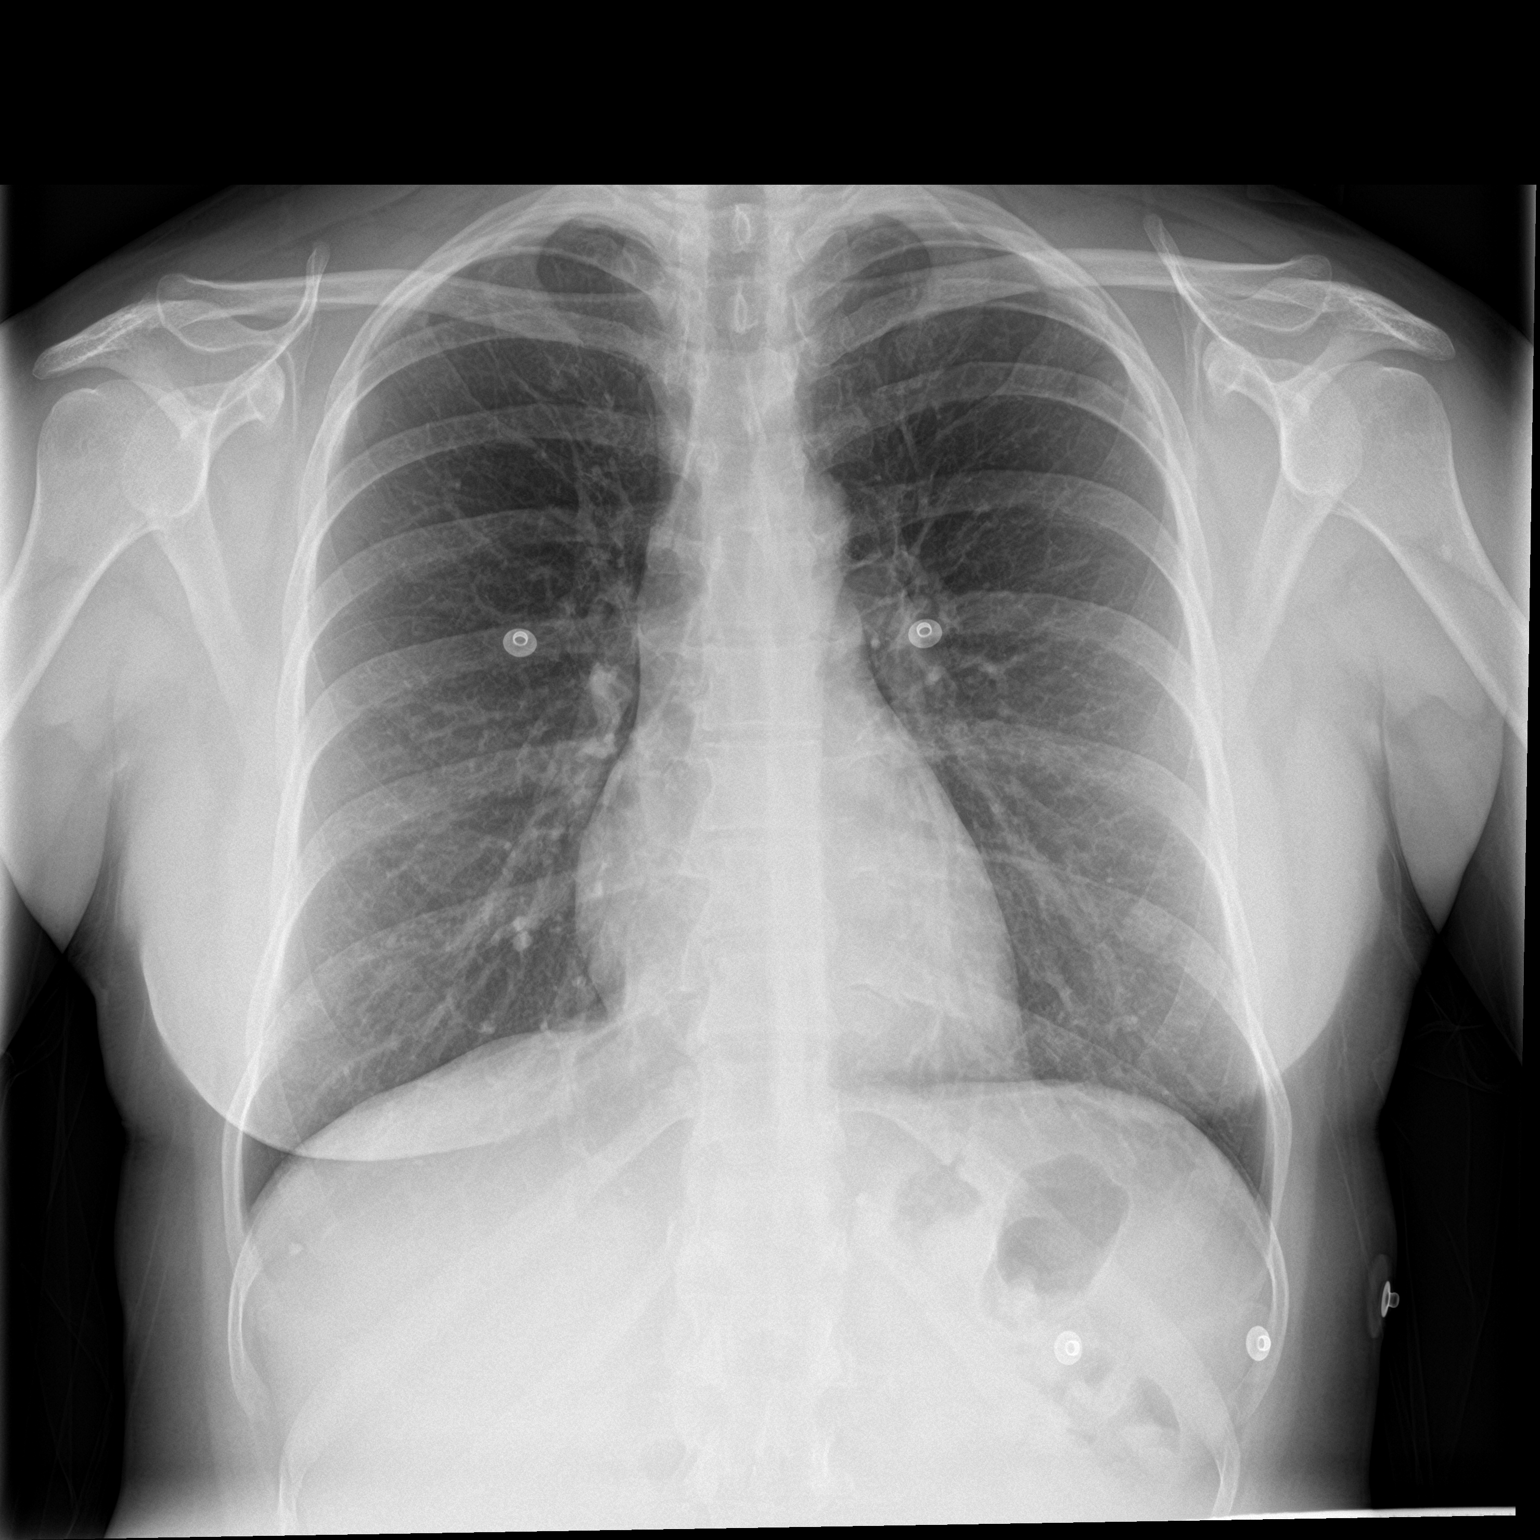

[chest lat]
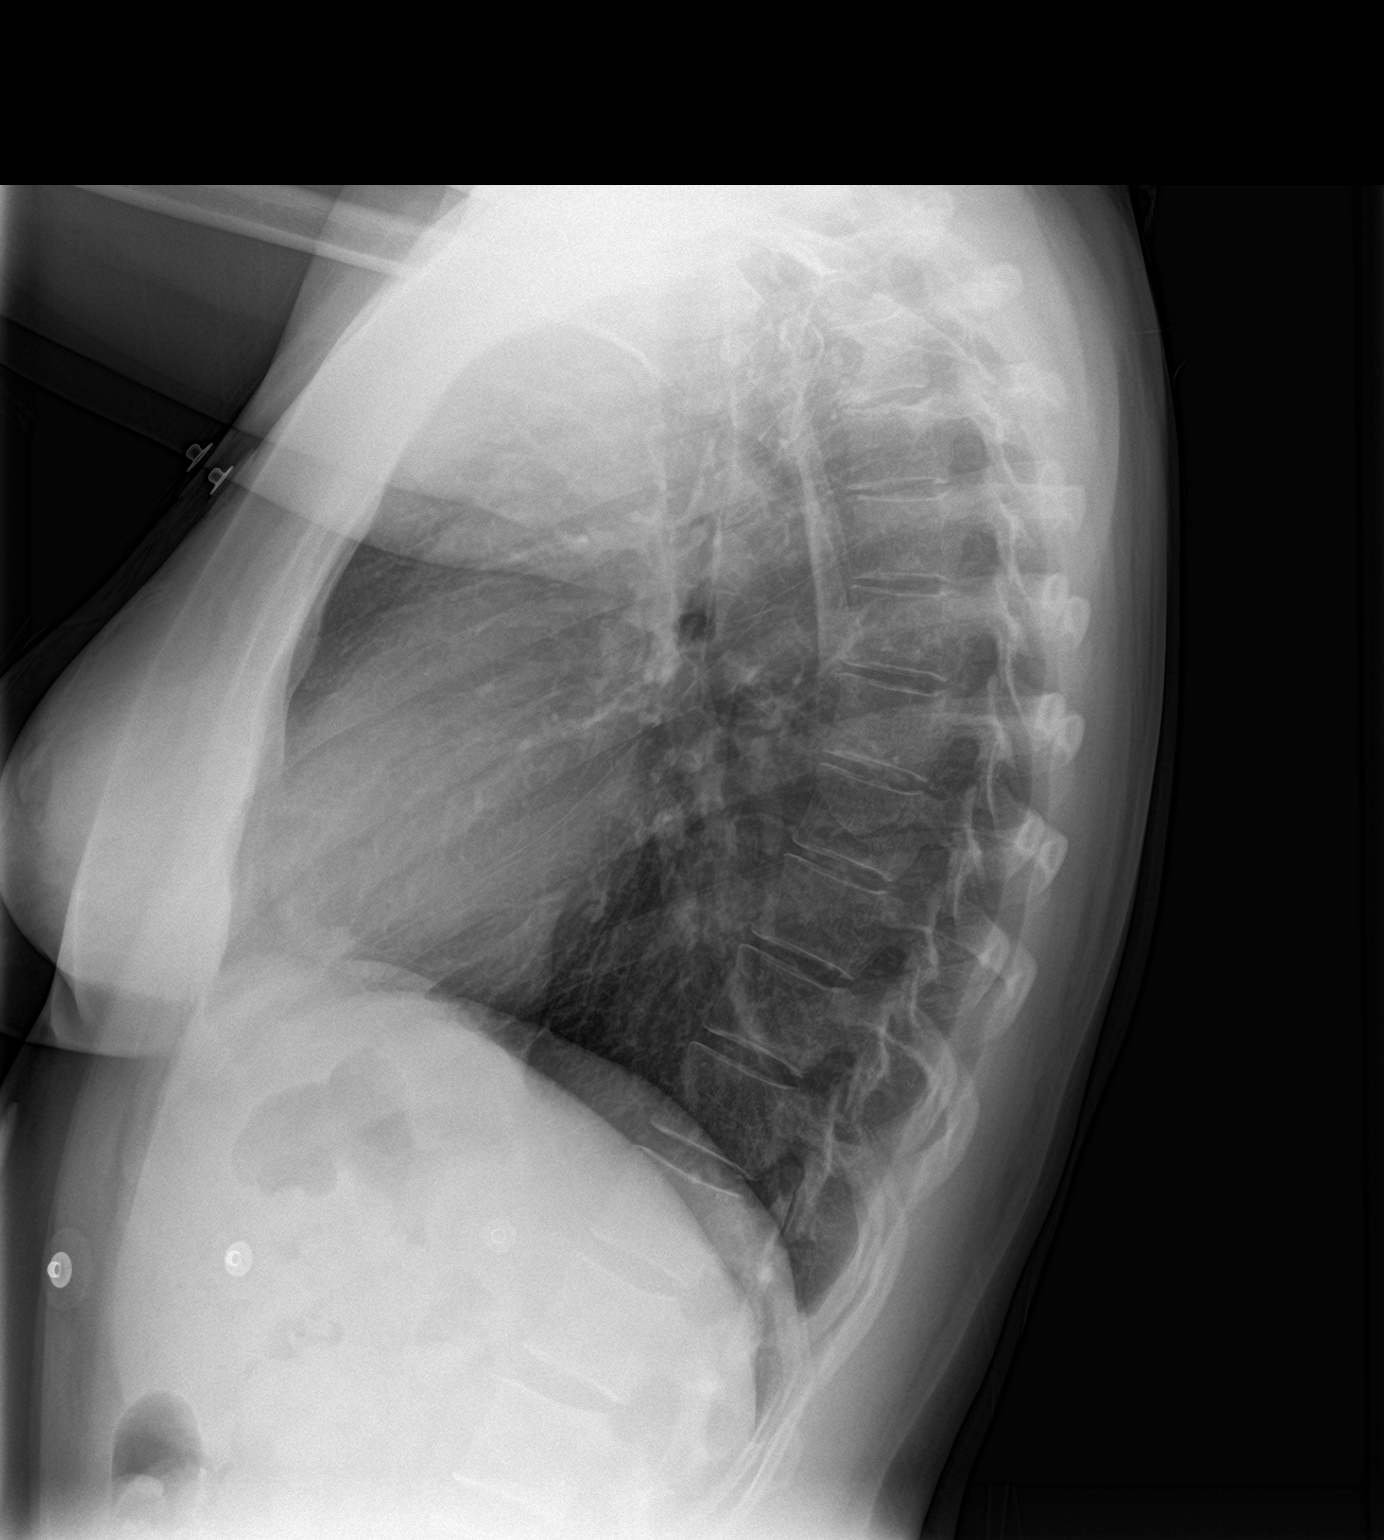

[2 of 2 positions shown; findings below may reference images not displayed]

FINDINGS: Cardiomediastinal silhouette is normal. Mediastinal contours appear
intact.

There is no evidence of focal airspace consolidation, pleural
effusion or pneumothorax.

Osseous structures are without acute abnormality. Soft tissues are
grossly normal.
IMPRESSION: No active cardiopulmonary disease.

## 2019-04-05 DIAGNOSIS — F459 Somatoform disorder, unspecified: Secondary | ICD-10-CM

## 2019-04-05 DIAGNOSIS — R569 Unspecified convulsions: Secondary | ICD-10-CM

## 2019-04-05 DIAGNOSIS — R531 Weakness: Secondary | ICD-10-CM

## 2019-04-05 DIAGNOSIS — G8384 Todd's paralysis (postepileptic): Secondary | ICD-10-CM

## 2019-04-06 DIAGNOSIS — R531 Weakness: Secondary | ICD-10-CM | POA: Diagnosis not present

## 2019-04-06 DIAGNOSIS — R569 Unspecified convulsions: Secondary | ICD-10-CM | POA: Diagnosis not present

## 2019-04-06 DIAGNOSIS — G8384 Todd's paralysis (postepileptic): Secondary | ICD-10-CM | POA: Diagnosis not present

## 2019-04-06 DIAGNOSIS — F459 Somatoform disorder, unspecified: Secondary | ICD-10-CM | POA: Diagnosis not present

## 2019-06-06 ENCOUNTER — Ambulatory Visit: Payer: 59 | Admitting: Neurology

## 2023-04-29 ENCOUNTER — Encounter: Payer: Self-pay | Admitting: Neurology

## 2023-05-01 ENCOUNTER — Encounter: Payer: Self-pay | Admitting: Neurology

## 2023-05-01 ENCOUNTER — Ambulatory Visit: Payer: 59 | Admitting: Neurology

## 2023-05-01 VITALS — BP 105/71 | HR 95 | Ht 61.0 in | Wt 149.0 lb

## 2023-05-01 DIAGNOSIS — R569 Unspecified convulsions: Secondary | ICD-10-CM

## 2023-05-01 NOTE — Patient Instructions (Addendum)
 Routine EEG, if normal we will proceed with 3 days ambulatory EEG  If any abnormalities, will discuss started antiseizure medication Continue to follow up with your doctors  Return as needed

## 2023-05-01 NOTE — Progress Notes (Signed)
 [facility]  PATIENT: Melissa Haynes DOB: 1975-11-04  REQUESTING CLINICIAN: Nonnie Done., MD HISTORY FROM: Patient  REASON FOR VISIT: Seizure    HISTORICAL  CHIEF COMPLAINT:  Chief Complaint  Patient presents with   New Patient (Initial Visit)    Pt in 12, here with Chrissie Noa Pt is referred by Dr Egbert Garibaldi for seizure. Pt states she's had 2 seizures this year, last one in Feb. Has not had any since.     HISTORY OF PRESENT ILLNESS:  This is a 48 year old woman past medical history of seizures, nonepileptic seizures, GERD, heart murmur who is presenting for evaluation and management of seizures.  She tells me her first seizure started around 2002.  First she was diagnosed with panic attacks as she will wake up on the floor unexplained.  In 2010 after having multiple seizures she was finally diagnosed with seizure disorder.  She was initially followed by Dr. Adelene Idler, had EEG which showed left temporal slowing.  She also had an ambulatory EEG, tells me that it showed "seizure versus dream".  While on medication, she did have side effect including paresthesia with topiramate, and increase irritability and anxiety with Keppra.  She describes her seizures as having a smell of sulfur, lipsmacking, she will have word confusion, husband tells me that her hand will roll up and sometimes she will shake.  She denies any major injury with her seizures, no tongue biting, urinary incontinence.  Patient tells me 3 years ago she followed-up at the [place], after having multiple spells that one, one of the spells was in the waiting room of the doctor office. She was diagnosed with nonepileptic seizures and all of her antiseizure medications were discontinued.  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  This has increased her anxiety, she started drinking more alcohol and  had 2 seizures, 1 in January and 1 in February.  She tells me on average she will have 2-3 seizures per year. She is not interested in restarting antiseizure medication because she did well off medication for the past 3 years.  Her psychiatrist has discontinued her stimulant and wanted her to see a neurologist for this event prior to restarting stimulant.    Handedness: Right handed   Onset: 2002 but diagnosed in 2010  Seizure Type: Smell of sulfur, followed by lip smacking, word salad, then she will lose consciousness. After the seizure, she will have right sided weakness.   Current frequency: was doing well until this year 1 in Jan and 1 in February   Any injuries from seizures: Denies   Seizure risk factors: Denies   Previous ASMs: Topiramate, Levetiracetam, Zonisamide   Currenty ASMs: None   ASMs side effects: Paresthesia (Topiramate), Irritability (Levetiracetam)   Brain Images: Normal in 2018  Previous EEGs: Left temporal slowing    OTHER MEDICAL CONDITIONS: Seizure disorder, GERD, ADHD   REVIEW OF SYSTEMS: Full 14 system review of systems performed and negative with exception of: As noted in the HPI   ALLERGIES: Allergies  Allergen Reactions   Frovatriptan Other (See Comments)    HOME MEDICATIONS: Outpatient Medications Prior to Visit  Medication Sig Dispense Refill   Cholecalciferol 1.25 MG (50000 UT) capsule Take 1 capsule every week by oral route for 90 days.     clonazePAM (KLONOPIN) 0.5 MG tablet Take 0.5 mg by mouth  daily as needed for anxiety.     eletriptan (RELPAX) 40 MG tablet Take by mouth.     estradiol-norethindrone (COMBIPATCH) 0.05-0.14 MG/DAY Apply 1 patch twice a week by transdermal route for 30 days.     LORazepam ER 3 MG CS24 Take by mouth.     methylphenidate 18 MG PO CR tablet TAKE 1 TABLET BY MOUTH EVERY DAY IN THE MORNING     ibuprofen (ADVIL) 200 MG tablet Take 3 tablets (600 mg total) by mouth every 6 (six) hours as needed. 30 tablet 0    methylphenidate (RITALIN) 10 MG tablet TAKE ONE TABLET AT NOON TIME AND ANOTHER AT 3PM     vitamin B-12 (CYANOCOBALAMIN) 100 MCG tablet Take 100 mcg by mouth daily.     cyclobenzaprine (FLEXERIL) 10 MG tablet Take 10 mg by mouth daily as needed for muscle spasms.     ondansetron (ZOFRAN) 4 MG tablet Take 1 tablet (4 mg total) by mouth every 8 (eight) hours as needed for nausea or vomiting. 12 tablet 0   oxyCODONE-acetaminophen (ROXICET) 5-325 MG per tablet Take 1 tablet by mouth every 4 (four) hours as needed. 30 tablet 0   pantoprazole (PROTONIX) 20 MG tablet Take 1 tablet (20 mg total) by mouth daily. 30 tablet 0   RELPAX 40 MG tablet Take 40 mg by mouth every 2 (two) hours as needed for migraine.      topiramate (TOPAMAX) 200 MG tablet Take 250 mg by mouth 2 (two) times daily.      No facility-administered medications prior to visit.    PAST MEDICAL HISTORY: Past Medical History:  Diagnosis Date   Anxiety    Dental bridge present    upper   Generalized anxiety disorder    Heart murmur    states no problems; has never had any cardiac testing   Hemorrhoids 12/20/2012   Migraines    Nonproductive cough 01/19/2013   Seizures (HCC)    due to anxiety disorder - last seizure > 1 yr. ago   Tachycardia    states heart rate normally 120-130; no cardiologist; no meds.    PAST SURGICAL HISTORY: Past Surgical History:  Procedure Laterality Date   DILATION AND CURETTAGE OF UTERUS     HEMORRHOID SURGERY N/A 01/26/2013   Procedure: HEMORRHOIDECTOMY;  Surgeon: Clovis Pu. Cornett, MD;  Location: [facility];  Service: General;  Laterality: N/A;   NOVASURE ABLATION     TONSILLECTOMY  1986    FAMILY HISTORY: Family History  Problem Relation Age of Onset   Cancer Father        pancreatic, lung   CAD Father    Stroke Maternal Grandmother     SOCIAL HISTORY: Social History   Socioeconomic History   Marital status: Married    Spouse name: Not on file   Number of children:  Not on file   Years of education: Not on file   Highest education level: Not on file  Occupational History   Not on file  Tobacco Use   Smoking status: Some Days    Current packs/day: 0.00    Types: Cigarettes    Last attempt to quit: 11/20/2010    Years since quitting: 12.4   Smokeless tobacco: Never  Vaping Use   Vaping status: Never Used  Substance and Sexual Activity   Alcohol use: Yes    Comment: occasionally   Drug use: No   Sexual activity: Not on file  Other Topics Concern   Not  on file  Social History Narrative   Not on file   Social Drivers of Health   Financial Resource Strain: Not on file  Food Insecurity: Not on file  Transportation Needs: Not on file  Physical Activity: Not on file  Stress: Not on file  Social Connections: Not on file  Intimate Partner Violence: Not on file    PHYSICAL EXAM  GENERAL EXAM/CONSTITUTIONAL: Vitals:  Vitals:   05/01/23 0814  BP: 105/71  Pulse: 95  Weight: 149 lb (67.6 kg)  Height: 5\' 1"  (1.549 m)   Body mass index is 28.15 kg/m. Wt Readings from Last 3 Encounters:  05/01/23 149 lb (67.6 kg)  05/17/14 135 lb (61.2 kg)  03/04/13 130 lb 9.6 oz (59.2 kg)   Patient is in no distress; well developed, nourished and groomed; neck is supple  MUSCULOSKELETAL: Gait, strength, tone, movements noted in Neurologic exam below  NEUROLOGIC: MENTAL STATUS:      No data to display         awake, alert, oriented to person, place and time recent and remote memory intact normal attention and concentration language fluent, comprehension intact, naming intact fund of knowledge appropriate  CRANIAL NERVE:  2nd, 3rd, 4th, 6th - Visual fields full to confrontation, extraocular muscles intact, no nystagmus 5th - facial sensation symmetric 7th - facial strength symmetric 8th - hearing intact 9th - palate elevates symmetrically, uvula midline 11th - shoulder shrug symmetric 12th - tongue protrusion midline  MOTOR:  normal  bulk and tone, full strength in the BUE, BLE  SENSORY:  normal and symmetric to light touch  COORDINATION:  finger-nose-finger, fine finger movements normal  GAIT/STATION:  normal   DIAGNOSTIC DATA (LABS, IMAGING, TESTING) - I reviewed patient records, labs, notes, testing and imaging myself where available.  Lab Results  Component Value Date   WBC 9.7 04/18/2017   HGB 13.3 04/18/2017   HCT 40.1 04/18/2017   MCV 93.7 04/18/2017   PLT 291 04/18/2017      Component Value Date/Time   NA 138 04/18/2017 1903   K 3.6 04/18/2017 1903   CL 103 04/18/2017 1903   CO2 25 04/18/2017 1903   GLUCOSE 102 (H) 04/18/2017 1903   BUN 6 04/18/2017 1903   CREATININE 0.78 04/18/2017 1903   CALCIUM 9.2 04/18/2017 1903   PROT 7.2 05/17/2014 1201   ALBUMIN 4.5 05/17/2014 1201   AST 21 05/17/2014 1201   ALT 29 05/17/2014 1201   ALKPHOS 36 (L) 05/17/2014 1201   BILITOT 0.9 05/17/2014 1201   GFRNONAA >60 04/18/2017 1903   GFRAA >60 04/18/2017 1903   No results found for: "CHOL", "HDL", "LDLCALC", "LDLDIRECT", "TRIG" No results found for: "HGBA1C" No results found for: "VITAMINB12" No results found for: "TSH"  MRI Brain 04/21/2016 Normal MRI of the brain.   Routine EEG 04/06/2019 Mild left temporal slowing   ASSESSMENT AND PLAN  48 y.o. year old female  with history of seizure versus nonepileptic seizures who is presenting with increased frequency of her seizures in the setting of increased stress.  She is currently off antiseizure medications for the past 3 years, but has increase seizures due to increase stress level and increase use of alcohol, plan will be to obtain a routine EEG and if normal will proceed with ambulatory EEG.  If any abnormality, will discuss need to start antiseizure medication.  If ambulatory EEG is normal, patient will continue to follow with PCP and consider psychiatry for management of her increased stress.  1. Seizure-like activity (HCC)     Patient  Instructions  Routine EEG, if normal we will proceed with 3 days ambulatory EEG  If any abnormalities, will discuss started antiseizure medication Continue to follow up with your doctors  Return as needed    Per [facility] statutes, patients with seizures are not allowed to drive until they have been seizure-free for six months.  Other recommendations include using caution when using heavy equipment or power tools. Avoid working on ladders or at heights. Take showers instead of baths.  Do not swim alone.  Ensure the water temperature is not too high on the home water heater. Do not go swimming alone. Do not lock yourself in a room alone (i.e. bathroom). When caring for infants or small children, sit down when holding, feeding, or changing them to minimize risk of injury to the child in the event you have a seizure. Maintain good sleep hygiene. Avoid alcohol.  Also recommend adequate sleep, hydration, good diet and minimize stress.   During the Seizure  - First, ensure adequate ventilation and place patients on the floor on their left side  Loosen clothing around the neck and ensure the airway is patent. If the patient is clenching the teeth, do not force the mouth open with any object as this can cause severe damage - Remove all items from the surrounding that can be hazardous. The patient may be oblivious to what's happening and may not even know what he or she is doing. If the patient is confused and wandering, either gently guide him/her away and block access to outside areas - Reassure the individual and be comforting - Call 911. In most cases, the seizure ends before EMS arrives. However, there are cases when seizures may last over 3 to 5 minutes. Or the individual may have developed breathing difficulties or severe injuries. If a pregnant patient or a person with diabetes develops a seizure, it is prudent to call an ambulance. - Finally, if the patient does not regain full  consciousness, then call EMS. Most patients will remain confused for about 45 to 90 minutes after a seizure, so you must use judgment in calling for help. - Avoid restraints but make sure the patient is in a bed with padded side rails - Place the individual in a lateral position with the neck slightly flexed; this will help the saliva drain from the mouth and prevent the tongue from falling backward - Remove all nearby furniture and other hazards from the area - Provide verbal assurance as the individual is regaining consciousness - Provide the patient with privacy if possible - Call for help and start treatment as ordered by the caregiver   After the Seizure (Postictal Stage)  After a seizure, most patients experience confusion, fatigue, muscle pain and/or a headache. Thus, one should permit the individual to sleep. For the next few days, reassurance is essential. Being calm and helping reorient the person is also of importance.  Most seizures are painless and end spontaneously. Seizures are not harmful to others but can lead to complications such as stress on the lungs, brain and the heart. Individuals with prior lung problems may develop labored breathing and respiratory distress.    Discussed Patients with epilepsy have a small risk of sudden unexpected death, a condition referred to as sudden unexpected death in epilepsy (SUDEP). SUDEP is defined specifically as the sudden, unexpected, witnessed or unwitnessed, nontraumatic and nondrowning death in patients with epilepsy with or without evidence  for a seizure, and excluding documented status epilepticus, in which post mortem examination does not reveal a structural or toxicologic cause for death     Orders Placed This Encounter  Procedures   EEG adult    No orders of the defined types were placed in this encounter.   Return if symptoms worsen or fail to improve.    Windell Norfolk, MD 05/01/2023, 10:46 AM  [facility] [address] [place], [place] [address] [phone]

## 2023-05-06 ENCOUNTER — Other Ambulatory Visit: Admitting: *Deleted

## 2023-05-12 ENCOUNTER — Ambulatory Visit: Admitting: Neurology

## 2023-05-12 DIAGNOSIS — R569 Unspecified convulsions: Secondary | ICD-10-CM

## 2023-05-12 NOTE — Procedures (Signed)
    History:  48 year old woman with seizure like activity   EEG classification: Awake and drowsy  Duration: 25 minutes   Technical aspects: This EEG study was done with scalp electrodes positioned according to the 10-20 International system of electrode placement. Electrical activity was reviewed with band pass filter of 1-70Hz , sensitivity of 7 uV/mm, display speed of 47mm/sec with a 60Hz  notched filter applied as appropriate. EEG data were recorded continuously and digitally stored.   Description of the recording: The background rhythms of this recording consists of a fairly well modulated medium amplitude alpha rhythm of 9 Hz that is reactive to eye opening and closure. Present in the anterior head region is a 15-20 Hz beta activity. Photic stimulation was performed, did not show any abnormalities. Hyperventilation was also performed, did not show any abnormalities. Drowsiness was manifested by background fragmentation. No abnormal epileptiform discharges seen during this recording. There was no focal slowing. There were no electrographic seizure identified.   Abnormality: None   Impression: This is a normal awake and drowsy EEG. No evidence of interictal epileptiform discharges. Normal EEGs, however, do not rule out epilepsy.    Melissa Rusconi, MD [facility]

## 2023-05-13 ENCOUNTER — Telehealth: Payer: Self-pay

## 2023-05-13 ENCOUNTER — Other Ambulatory Visit: Payer: Self-pay | Admitting: Neurology

## 2023-05-13 ENCOUNTER — Encounter: Payer: Self-pay | Admitting: Neurology

## 2023-05-13 DIAGNOSIS — R569 Unspecified convulsions: Secondary | ICD-10-CM

## 2023-05-13 NOTE — Telephone Encounter (Signed)
 Thanks

## 2023-05-13 NOTE — Telephone Encounter (Signed)
 Melissa Haynes

## 2023-05-13 NOTE — Telephone Encounter (Signed)
 Pt has called to inform April, RN that due to cost she will not have the EEG done at this time.
